# Patient Record
Sex: Male | Born: 1970 | Race: Black or African American | Hispanic: No | Marital: Married | Smoking: Never smoker
Health system: Southern US, Community
[De-identification: ages and names within clinical notes are randomized; demographics above are authoritative.]

## PROBLEM LIST (undated history)

## (undated) DIAGNOSIS — M109 Gout, unspecified: Secondary | ICD-10-CM

## (undated) DIAGNOSIS — G4730 Sleep apnea, unspecified: Secondary | ICD-10-CM

## (undated) DIAGNOSIS — Z87442 Personal history of urinary calculi: Secondary | ICD-10-CM

## (undated) DIAGNOSIS — R000 Tachycardia, unspecified: Secondary | ICD-10-CM

## (undated) DIAGNOSIS — N1330 Unspecified hydronephrosis: Secondary | ICD-10-CM

## (undated) DIAGNOSIS — Z9989 Dependence on other enabling machines and devices: Secondary | ICD-10-CM

## (undated) DIAGNOSIS — G4733 Obstructive sleep apnea (adult) (pediatric): Secondary | ICD-10-CM

## (undated) DIAGNOSIS — N390 Urinary tract infection, site not specified: Secondary | ICD-10-CM

## (undated) DIAGNOSIS — I517 Cardiomegaly: Secondary | ICD-10-CM

## (undated) DIAGNOSIS — Z973 Presence of spectacles and contact lenses: Secondary | ICD-10-CM

## (undated) DIAGNOSIS — E538 Deficiency of other specified B group vitamins: Secondary | ICD-10-CM

## (undated) DIAGNOSIS — B9620 Unspecified Escherichia coli [E. coli] as the cause of diseases classified elsewhere: Secondary | ICD-10-CM

## (undated) DIAGNOSIS — N179 Acute kidney failure, unspecified: Secondary | ICD-10-CM

## (undated) DIAGNOSIS — K5900 Constipation, unspecified: Secondary | ICD-10-CM

## (undated) DIAGNOSIS — N201 Calculus of ureter: Secondary | ICD-10-CM

## (undated) DIAGNOSIS — E119 Type 2 diabetes mellitus without complications: Secondary | ICD-10-CM

## (undated) DIAGNOSIS — I10 Essential (primary) hypertension: Secondary | ICD-10-CM

## (undated) DIAGNOSIS — I519 Heart disease, unspecified: Secondary | ICD-10-CM

## (undated) DIAGNOSIS — E1169 Type 2 diabetes mellitus with other specified complication: Secondary | ICD-10-CM

## (undated) DIAGNOSIS — R319 Hematuria, unspecified: Secondary | ICD-10-CM

## (undated) HISTORY — DX: Sleep apnea, unspecified: G47.30

## (undated) HISTORY — PX: VASECTOMY: SHX75

## (undated) HISTORY — DX: Type 2 diabetes mellitus with other specified complication: E11.69

## (undated) HISTORY — DX: Obstructive sleep apnea (adult) (pediatric): G47.33

## (undated) HISTORY — DX: Dependence on other enabling machines and devices: Z99.89

## (undated) HISTORY — DX: Deficiency of other specified B group vitamins: E53.8

---

## 1991-02-08 HISTORY — PX: CIRCUMCISION: SUR203

## 2008-01-14 ENCOUNTER — Inpatient Hospital Stay (HOSPITAL_COMMUNITY): Admission: EM | Admit: 2008-01-14 | Discharge: 2008-01-15 | Payer: Self-pay | Admitting: Emergency Medicine

## 2008-01-23 ENCOUNTER — Ambulatory Visit: Payer: Self-pay | Admitting: Internal Medicine

## 2009-11-20 ENCOUNTER — Encounter: Admission: RE | Admit: 2009-11-20 | Discharge: 2009-11-20 | Payer: Self-pay | Admitting: Internal Medicine

## 2010-06-22 NOTE — H&P (Signed)
NAMEELIGAH, ANELLO               ACCOUNT NO.:  [id number]   MEDICAL RECORD NO.:  [id number]          PATIENT TYPE:  INP   LOCATION:  0103                         FACILITY:  [facility]   PHYSICIAN:  Maryla Morrow, MD        DATE OF BIRTH:  1970/11/25   DATE OF ADMISSION:  01/13/2008  DATE OF DISCHARGE:                              HISTORY & PHYSICAL   PRIMARY CARE PHYSICIAN:  Unassigned.   CHIEF COMPLAINT:  Polyuria.   HISTORY OF PRESENT ILLNESS:  Mr. 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  He was also experiencing occasional episodes of blurry  vision.  He brought this to the attention of his mother who incidentally  checked his blood glucose and was found to be at a critical level.  He  was thereby sent to the ER for further evaluation.  In the ER his blood  glucose was found tobe  586.  He currently denies any symptom whatsoever  including chest pain, shortness of breath, headache, abdominal pain,  nausea, vomiting.  He does complain of polyuria and polydipsia as above.   PAST MEDICAL HISTORY:  None.   PAST SURGICAL HISTORY:  1. Vasectomy.  2. Circumcision.   ALLERGIES:  None.   MEDICATIONS:  None.   FAMILY HISTORY:  History of diabetes in mother, as well as kidney cancer  in father.  Negative for coronary artery disease, hypothyroidism.   SOCIAL HISTORY:  Negative for tobacco, alcohol or drug abuse.  The  patient is Veterinary surgeon by profession.  He is married and has two healthy  children.   REVIEW OF SYSTEMS:  All pertinent positives in HPI.  Complete 12-point  review of systems performed.   PHYSICAL EXAMINATION:  GENERAL:  40 year old [place] American male who  is currently oriented to time, place, and person, in no acute apparent  distress, obese.  VITAL SIGNS:  Stable vital signs.  HEENT:  Pupils equal, round, and  reactive to light.  Extraocular  movements are intact.  No JVD.  No lymphadenopathy.  Head is atraumatic  and normocephalic.  Oropharynx is clear.  CHEST:  Clear to auscultation bilaterally.  Equal expansion bilaterally.  HEART:  Regular rate and rhythm.  S1, S2 normal.  No murmurs, rubs or  gallops.  ABDOMEN:  Soft, nontender, nondistended.  EXTREMITIES:  No clubbing, cyanosis or edema.  Dorsalis pedis pulses are  2+ bilaterally.  Strength is 5/5. Sensory is intact.  Speech is normal.   LABORATORY AND DIAGNOSTIC DATA:  The pertinent labs include glucose of  586 and sodium 130 with potassium 4.3 and normal anion gap with  creatinine of 1.  His hemoglobin was 15.6 with a white count of 10.1.  Urinalysis was positive for glucose, otherwise negative.  Cardiac  enzymes first set was negative.   ASSESSMENT AND PLAN:  Mr. Garman is a very pleasant 40 year old gentleman  with:  1. Seizure.  2. Hyperglycemia with new-onset type 2 diabetes.  3. Obesity.  4. Hyponatremia, probably secondary to hyperglycemia.   RECOMMENDATIONS:  1. Start the patient on IV insulin and correct the sugar with a high      target of 150 by glucose stabilizer protocol.  The patient will      eventually be switched to basal insulin and mean coverage.  2. We will check a.m. labs for fasting lipid panel and hemoglobin A1c.  3. We will start patient on metformin and a statin.  4. P.r.n. medications.  5. Diabetic education and counseling in morning.      Maryla Morrow, MD  Electronically Signed     AP/MEDQ  D:  01/13/2008  T:  01/14/2008  Job:  [id number]

## 2010-06-22 NOTE — Discharge Summary (Signed)
Alan Payne, Alan Payne               ACCOUNT NO.:  [id number]   MEDICAL RECORD NO.:  [id number]          PATIENT TYPE:  INP   LOCATION:  1513                         FACILITY[facility]  [facility]   PHYSICIAN:  Marcellus Scott, MD     DATE OF BIRTH:  02-05-1971   DATE OF ADMISSION:  01/13/2008  DATE OF DISCHARGE:  01/15/2008                               DISCHARGE SUMMARY   PRIMARY MEDICAL DOCTOR:  Gentry Fitz.   DISCHARGE DIAGNOSES:  1. Newly diagnosed type 2 diabetes mellitus.  2. Hyperosmolar nonketotic state - resolved.  3. Pseudohyponatremia - resolved.  4. Dyslipidemia.  5. Obesity.   DISCHARGE MEDICATIONS:  1. Lantus 20 units subcutaneously daily.  2. Metformin 500 mg p.o. b.i.d.  3. Pravastatin 10 mg p.o. nightly.  4. NovoLog sliding scale insulin per directions.   PROCEDURE:  None.   PERTINENT LABS:  Lipid panel today with cholesterol 181, triglycerides  665, HDL 24, LDL and VLDL could not be calculated secondary to  hypertriglyceridemia.  Basic Metabolic Panel today with sodium 135,  potassium 4.5, chloride 107, bicarb 24, glucose 266, BUN 8, creatinine  0.75, calcium 9.1.  CBC was with hemoglobin 14.8, hematocrit 43, white  blood cells 8.2, platelets 247.  TSH 1.982.  Hemoglobin A1c 9.4.  Hepatic panel yesterday unremarkable, except for an albumin of 3.3.  Troponin and cardiac enzymes x1 negative.  Urinalysis not indicative of  urinary tract infection.  There was no proteinuria.   CONSULTATIONS:  Diabetes treatment program.   HOSPITAL COURSE AND PATIENT DISPOSITION:  Alan Payne is a very pleasant  40 year old African American male patient with no significant past  medical history.  He does have a strong family history of diabetes.  His  mother is a type 2 diabetic and his maternal uncle is a type 1 diabetic.  The patient has been experiencing polyuria, polydipsia and episodes of  blurred vision ongoing for a couple of weeks.  He brought this to the  attention of his mother who  being a diabetic, checked his blood sugar  and found it to be critically high and advised him to go to the nearest  ED.  In the emergency room the patient's blood sugar was found to be  586.  He was thereby admitted for further evaluation and management.  The patient was admitted to the step-down unit.  He was hydrated with IV  fluids and placed on an IV insulin drip.  With these measures the  patient's electrolytes improved.  He was then transitioned from IV  insulin to Lantus and sliding scale insulin.  Metformin and simvastatin  were started.  Diabetes education and instructions were provided.  Nutrition consult was also done.  Today, the patient's blood sugars  range between 202-226.  The diabetes treatment program has recommended  increasing his Lantus and increasing his NovoLog to the resistant scale.  The patient otherwise is asymptomatic at this time.  He will be  discharged.  He does not have primary medical doctor or insurance.  We  will try to arrange a [facility] appointment for him.  If he is not  eligible there, then we will provide referral to Della Goo, M.D.  who is still accepting new patients.  His Basic Metabolic Panel and  fasting lipids are to be repeated in a week from now.  The patient has  been educated regarding self-administration of insulin, frequent blood  sugar checks and hypoglycemic symptoms and management.   Time spent coordinating discharge: 30 minutes.      Marcellus Scott, MD  Electronically Signed     AH/MEDQ  D:  01/15/2008  T:  01/15/2008  Job:  [id number]   cc:   Melvern Banker  Fax: [phone]   Della Goo, M.D.  Fax: [phone]

## 2010-11-12 LAB — URINALYSIS, ROUTINE W REFLEX MICROSCOPIC
Bilirubin Urine: NEGATIVE
Glucose, UA: >1000 mg/dL — AB
Hgb urine dipstick: NEGATIVE
Ketones, ur: NEGATIVE mg/dL
Leukocytes, UA: NEGATIVE
Nitrite: NEGATIVE
Protein, ur: NEGATIVE mg/dL
Specific Gravity, Urine: 1.03 (ref 1.005–1.030)
Urobilinogen, UA: 0.2 mg/dL (ref 0.0–1.0)
pH: 6.5 (ref 5.0–8.0)

## 2010-11-12 LAB — CBC
HCT: 41.7 % (ref 39.0–52.0)
HCT: 43 % (ref 39.0–52.0)
HCT: 44.5 % (ref 39.0–52.0)
Hemoglobin: 14.3 g/dL (ref 13.0–17.0)
Hemoglobin: 14.8 g/dL (ref 13.0–17.0)
Hemoglobin: 15 g/dL (ref 13.0–17.0)
MCHC: 33.8 g/dL (ref 30.0–36.0)
MCHC: 34.4 g/dL (ref 30.0–36.0)
MCHC: 34.4 g/dL (ref 30.0–36.0)
MCV: 85.9 fL (ref 78.0–100.0)
MCV: 86.5 fL (ref 78.0–100.0)
MCV: 86.9 fL (ref 78.0–100.0)
Platelets: 239 10*3/uL (ref 150–400)
Platelets: 247 10*3/uL (ref 150–400)
Platelets: 250 10*3/uL (ref 150–400)
RBC: 4.85 MIL/uL (ref 4.22–5.81)
RBC: 4.97 MIL/uL (ref 4.22–5.81)
RBC: 5.12 MIL/uL (ref 4.22–5.81)
RDW: 13.7 % (ref 11.5–15.5)
RDW: 13.7 % (ref 11.5–15.5)
RDW: 13.7 % (ref 11.5–15.5)
WBC: 10.1 10*3/uL (ref 4.0–10.5)
WBC: 8.2 10*3/uL (ref 4.0–10.5)
WBC: 9.3 10*3/uL (ref 4.0–10.5)

## 2010-11-12 LAB — GLUCOSE, CAPILLARY
Glucose-Capillary: 107 mg/dL — ABNORMAL HIGH (ref 70–99)
Glucose-Capillary: 132 mg/dL — ABNORMAL HIGH (ref 70–99)
Glucose-Capillary: 139 mg/dL — ABNORMAL HIGH (ref 70–99)
Glucose-Capillary: 155 mg/dL — ABNORMAL HIGH (ref 70–99)
Glucose-Capillary: 163 mg/dL — ABNORMAL HIGH (ref 70–99)
Glucose-Capillary: 167 mg/dL — ABNORMAL HIGH (ref 70–99)
Glucose-Capillary: 174 mg/dL — ABNORMAL HIGH (ref 70–99)
Glucose-Capillary: 176 mg/dL — ABNORMAL HIGH (ref 70–99)
Glucose-Capillary: 181 mg/dL — ABNORMAL HIGH (ref 70–99)
Glucose-Capillary: 202 mg/dL — ABNORMAL HIGH (ref 70–99)
Glucose-Capillary: 221 mg/dL — ABNORMAL HIGH (ref 70–99)
Glucose-Capillary: 221 mg/dL — ABNORMAL HIGH (ref 70–99)
Glucose-Capillary: 225 mg/dL — ABNORMAL HIGH (ref 70–99)
Glucose-Capillary: 226 mg/dL — ABNORMAL HIGH (ref 70–99)
Glucose-Capillary: 265 mg/dL — ABNORMAL HIGH (ref 70–99)
Glucose-Capillary: 384 mg/dL — ABNORMAL HIGH (ref 70–99)
Glucose-Capillary: 473 mg/dL — ABNORMAL HIGH (ref 70–99)
Glucose-Capillary: 561 mg/dL (ref 70–99)
Glucose-Capillary: >600 mg/dL (ref 70–99)

## 2010-11-12 LAB — COMPREHENSIVE METABOLIC PANEL
ALT: 19 U/L (ref 0–53)
AST: 17 U/L (ref 0–37)
Albumin: 3.3 g/dL — ABNORMAL LOW (ref 3.5–5.2)
Alkaline Phosphatase: 84 U/L (ref 39–117)
BUN: 9 mg/dL (ref 6–23)
CO2: 26 mEq/L (ref 19–32)
Calcium: 9.2 mg/dL (ref 8.4–10.5)
Chloride: 103 mEq/L (ref 96–112)
Creatinine, Ser: 0.66 mg/dL (ref 0.4–1.5)
GFR calc Af Amer: >60 mL/min (ref >60)
GFR calc non Af Amer: >60 mL/min (ref >60)
Glucose, Bld: 168 mg/dL — ABNORMAL HIGH (ref 70–99)
Potassium: 3.5 mEq/L (ref 3.5–5.1)
Sodium: 136 mEq/L (ref 135–145)
Total Bilirubin: 0.8 mg/dL (ref 0.3–1.2)
Total Protein: 6.3 g/dL (ref 6.0–8.3)

## 2010-11-12 LAB — DIFFERENTIAL
Basophils Absolute: 0 10*3/uL (ref 0.0–0.1)
Basophils Relative: 0 % (ref 0–1)
Eosinophils Absolute: 0.2 10*3/uL (ref 0.0–0.7)
Eosinophils Relative: 2 % (ref 0–5)
Lymphocytes Relative: 20 % (ref 12–46)
Lymphs Abs: 2 10*3/uL (ref 0.7–4.0)
Monocytes Absolute: 0.6 10*3/uL (ref 0.1–1.0)
Monocytes Relative: 5 % (ref 3–12)
Neutro Abs: 7.4 10*3/uL (ref 1.7–7.7)
Neutrophils Relative %: 73 % (ref 43–77)

## 2010-11-12 LAB — CK TOTAL AND CKMB (NOT AT ARMC)
CK, MB: 0.8 ng/mL (ref 0.3–4.0)
Relative Index: 0.4 (ref 0.0–2.5)
Total CK: 206 U/L (ref 7–232)

## 2010-11-12 LAB — POCT I-STAT, CHEM 8
BUN: 11 mg/dL (ref 6–23)
Calcium, Ion: 1.22 mmol/L (ref 1.12–1.32)
Chloride: 97 mEq/L (ref 96–112)
Creatinine, Ser: 1 mg/dL (ref 0.4–1.5)
Glucose, Bld: 586 mg/dL (ref 70–99)
HCT: 46 % (ref 39.0–52.0)
Hemoglobin: 15.6 g/dL (ref 13.0–17.0)
Potassium: 4.3 mEq/L (ref 3.5–5.1)
Sodium: 130 mEq/L — ABNORMAL LOW (ref 135–145)
TCO2: 26 mmol/L (ref 0–100)

## 2010-11-12 LAB — LIPID PANEL
Cholesterol: 181 mg/dL (ref 0–200)
Cholesterol: 191 mg/dL (ref 0–200)
HDL: 23 mg/dL — ABNORMAL LOW (ref >39)
HDL: 24 mg/dL — ABNORMAL LOW (ref >39)
LDL Cholesterol: UNDETERMINED mg/dL (ref 0–99)
LDL Cholesterol: UNDETERMINED mg/dL (ref 0–99)
Total CHOL/HDL Ratio: 7.5 RATIO
Total CHOL/HDL Ratio: 8.3 RATIO
Triglycerides: 665 mg/dL — ABNORMAL HIGH (ref <150)
Triglycerides: 839 mg/dL — ABNORMAL HIGH (ref <150)
VLDL: UNDETERMINED mg/dL (ref 0–40)
VLDL: UNDETERMINED mg/dL (ref 0–40)

## 2010-11-12 LAB — HEMOGLOBIN A1C
Hgb A1c MFr Bld: 9.4 % — ABNORMAL HIGH (ref 4.6–6.1)
Mean Plasma Glucose: 223 mg/dL

## 2010-11-12 LAB — BASIC METABOLIC PANEL
BUN: 8 mg/dL (ref 6–23)
CO2: 24 mEq/L (ref 19–32)
Calcium: 9.1 mg/dL (ref 8.4–10.5)
Chloride: 107 mEq/L (ref 96–112)
Creatinine, Ser: 0.75 mg/dL (ref 0.4–1.5)
GFR calc Af Amer: >60 mL/min (ref >60)
GFR calc non Af Amer: >60 mL/min (ref >60)
Glucose, Bld: 266 mg/dL — ABNORMAL HIGH (ref 70–99)
Potassium: 4.5 mEq/L (ref 3.5–5.1)
Sodium: 135 mEq/L (ref 135–145)

## 2010-11-12 LAB — CALCIUM: Calcium: 9.1 mg/dL (ref 8.4–10.5)

## 2010-11-12 LAB — URINE MICROSCOPIC-ADD ON: Urine-Other: NONE SEEN

## 2010-11-12 LAB — TSH: TSH: 1.982 u[IU]/mL (ref 0.350–4.500)

## 2010-11-12 LAB — MAGNESIUM: Magnesium: 2.1 mg/dL (ref 1.5–2.5)

## 2010-11-12 LAB — TROPONIN I: Troponin I: 0.01 ng/mL (ref 0.00–0.06)

## 2010-11-12 LAB — PHOSPHORUS: Phosphorus: 3.6 mg/dL (ref 2.3–4.6)

## 2010-12-29 ENCOUNTER — Encounter (INDEPENDENT_AMBULATORY_CARE_PROVIDER_SITE_OTHER): Payer: Self-pay | Admitting: Surgery

## 2011-01-05 ENCOUNTER — Ambulatory Visit (INDEPENDENT_AMBULATORY_CARE_PROVIDER_SITE_OTHER): Payer: BC Managed Care – PPO | Admitting: Surgery

## 2011-01-05 ENCOUNTER — Encounter (INDEPENDENT_AMBULATORY_CARE_PROVIDER_SITE_OTHER): Payer: Self-pay | Admitting: Surgery

## 2011-01-05 VITALS — BP 124/86 | HR 64 | Temp 96.6°F | Resp 16 | Ht 72.0 in | Wt 324.0 lb

## 2011-01-05 DIAGNOSIS — K4090 Unilateral inguinal hernia, without obstruction or gangrene, not specified as recurrent: Secondary | ICD-10-CM

## 2011-01-05 DIAGNOSIS — N451 Epididymitis: Secondary | ICD-10-CM | POA: Insufficient documentation

## 2011-01-05 MED ORDER — NAPROXEN 500 MG PO TABS: 500.0000 mg | ORAL_TABLET | Freq: Two times a day (BID) | ORAL | Status: AC

## 2011-01-05 MED ORDER — DOXYCYCLINE HYCLATE 100 MG PO TABS: 100.0000 mg | ORAL_TABLET | Freq: Two times a day (BID) | ORAL | Status: AC

## 2011-01-05 NOTE — Progress Notes (Signed)
Chief Complaint  Patient presents with  . New Evaluation    Eval possible right inguinal hernia - referral by Dr. Juline Patch    HISTORY: Patient is a 40 year old black male referred by his primary care physician for possible right inguinal hernia. Patient gives a history of intermittent right testicular pain over the past year. It has improved in recent weeks. He had noted some swelling immediately adjacent to the testicle. He denies any bulge in the groin. Pain is more significant with physical activity. Patient has not had any prior hernia repairs.  Patient did undergo a vasectomy 12 years ago by his urologist.   Past Medical History  Diagnosis Date  . OSA on CPAP   . Diabetes mellitus      Current Outpatient Prescriptions  Medication Sig Dispense Refill  . aspirin 81 MG tablet Take 81 mg by mouth daily.        Marland Kitchen doxycycline (VIBRA-TABS) 100 MG tablet Take 1 tablet (100 mg total) by mouth 2 (two) times daily.  28 tablet  0  . metFORMIN (GLUCOPHAGE) 500 MG tablet Take 500 mg by mouth 2 (two) times daily with a meal.        . naproxen (NAPROSYN) 500 MG tablet Take 1 tablet (500 mg total) by mouth 2 (two) times daily with a meal.  30 tablet  1  . saxagliptin HCl (ONGLYZA) 5 MG TABS tablet Take by mouth daily.           Allergies  Allergen Reactions  . Janumet      Family History  Problem Relation Age of Onset  . Kidney cancer Father   . Hypertension Father   . Cancer Father     kidney  . Diabetes Mother      History   Social History  . Marital Status: Married    Spouse Name: N/A    Number of Children: N/A  . Years of Education: N/A   Social History Main Topics  . Smoking status: Never Smoker   . Smokeless tobacco: Never Used  . Alcohol Use: No  . Drug Use: No  . Sexually Active: None   Other Topics Concern  . None   Social History Narrative  . None     REVIEW OF SYSTEMS - PERTINENT POSITIVES ONLY: Right testicular pain, intermittent; no  obstructive symptoms   EXAM: Filed Vitals:   01/05/11 0858  BP: 124/86  Pulse: 64  Temp: 96.6 F (35.9 C)  Resp: 16    HEENT: normocephalic; pupils equal and reactive; sclerae clear; dentition good; mucous membranes moist NECK:  No nodules; symmetric on extension; no palpable anterior or posterior cervical lymphadenopathy; no supraclavicular masses; no tenderness CHEST: clear to auscultation bilaterally without rales, rhonchi, or wheezes CARDIAC: regular rate and rhythm without significant murmur; peripheral pulses are full GU:  Right testicle with tenderness at epididymis; no masses; no sign of inguinal hernia either side with cough or Valsalva; penis normal EXT:  non-tender without edema; no deformity NEURO: no gross focal deficits; no sign of tremor   LABORATORY RESULTS: See E-Chart for most recent results   RADIOLOGY RESULTS: See E-Chart or I-Site for most recent results   IMPRESSION: #1 no physical findings of inguinal hernia #2 right testicular tenderness, suspect epididymitis   PLAN: The patient and I discussed the above findings. At this point I do not think he requires surgical intervention. I would like to treat him with doxycycline and Naprosyn for 2 weeks and then evaluate him  again here in the office with repeat physical examination.  The patient is in agreement and will return in approximately 4 weeks for repeat examination.   Velora Heckler, MD, FACS General & Endocrine Surgery [facility]    Visit Diagnoses: 1. Inguinal hernia unilateral, non-recurrent, right     Primary Care Physician: Juline Patch, MD, MD

## 2011-01-05 NOTE — Patient Instructions (Signed)
Wear supportive underwear for comfort.  Warm tub soaks for comfort.  Avoid lifting.  tmg

## 2011-02-16 ENCOUNTER — Encounter (INDEPENDENT_AMBULATORY_CARE_PROVIDER_SITE_OTHER): Payer: BC Managed Care – PPO | Admitting: Surgery

## 2012-02-03 ENCOUNTER — Emergency Department (HOSPITAL_COMMUNITY): Payer: BC Managed Care – PPO

## 2012-02-03 ENCOUNTER — Emergency Department (HOSPITAL_COMMUNITY)
Admission: EM | Admit: 2012-02-03 | Discharge: 2012-02-03 | Disposition: A | Discharge status: Home / Self Care | Payer: BC Managed Care – PPO | Attending: Emergency Medicine | Admitting: Emergency Medicine

## 2012-02-03 ENCOUNTER — Encounter (HOSPITAL_COMMUNITY): Payer: Self-pay | Admitting: Emergency Medicine

## 2012-02-03 DIAGNOSIS — Z9852 Vasectomy status: Secondary | ICD-10-CM | POA: Insufficient documentation

## 2012-02-03 DIAGNOSIS — Z7982 Long term (current) use of aspirin: Secondary | ICD-10-CM | POA: Insufficient documentation

## 2012-02-03 DIAGNOSIS — G4733 Obstructive sleep apnea (adult) (pediatric): Secondary | ICD-10-CM | POA: Insufficient documentation

## 2012-02-03 DIAGNOSIS — Z79899 Other long term (current) drug therapy: Secondary | ICD-10-CM | POA: Insufficient documentation

## 2012-02-03 DIAGNOSIS — N200 Calculus of kidney: Secondary | ICD-10-CM | POA: Insufficient documentation

## 2012-02-03 DIAGNOSIS — E119 Type 2 diabetes mellitus without complications: Secondary | ICD-10-CM | POA: Insufficient documentation

## 2012-02-03 LAB — COMPREHENSIVE METABOLIC PANEL WITH GFR
ALT: 23 U/L (ref 0–53)
AST: 33 U/L (ref 0–37)
Albumin: 3.7 g/dL (ref 3.5–5.2)
Alkaline Phosphatase: 69 U/L (ref 39–117)
BUN: 9 mg/dL (ref 6–23)
CO2: 22 meq/L (ref 19–32)
Calcium: 9.2 mg/dL (ref 8.4–10.5)
Chloride: 105 meq/L (ref 96–112)
Creatinine, Ser: 0.94 mg/dL (ref 0.50–1.35)
GFR calc Af Amer: >90 mL/min (ref >90)
GFR calc non Af Amer: >90 mL/min (ref >90)
Glucose, Bld: 185 mg/dL — ABNORMAL HIGH (ref 70–99)
Potassium: 4.5 meq/L (ref 3.5–5.1)
Sodium: 136 meq/L (ref 135–145)
Total Bilirubin: 0.3 mg/dL (ref 0.3–1.2)
Total Protein: 7.5 g/dL (ref 6.0–8.3)

## 2012-02-03 LAB — CBC WITH DIFFERENTIAL/PLATELET
Basophils Absolute: 0 10*3/uL (ref 0.0–0.1)
Basophils Relative: 0 % (ref 0–1)
Eosinophils Absolute: 0 10*3/uL (ref 0.0–0.7)
Eosinophils Relative: 0 % (ref 0–5)
HCT: 41.1 % (ref 39.0–52.0)
Hemoglobin: 13.7 g/dL (ref 13.0–17.0)
Lymphocytes Relative: 12 % (ref 12–46)
Lymphs Abs: 1.2 10*3/uL (ref 0.7–4.0)
MCH: 27.8 pg (ref 26.0–34.0)
MCHC: 33.3 g/dL (ref 30.0–36.0)
MCV: 83.5 fL (ref 78.0–100.0)
Monocytes Absolute: 0.7 10*3/uL (ref 0.1–1.0)
Monocytes Relative: 7 % (ref 3–12)
Neutro Abs: 8.1 10*3/uL — ABNORMAL HIGH (ref 1.7–7.7)
Neutrophils Relative %: 81 % — ABNORMAL HIGH (ref 43–77)
Platelets: 229 10*3/uL (ref 150–400)
RBC: 4.92 MIL/uL (ref 4.22–5.81)
RDW: 13.7 % (ref 11.5–15.5)
WBC: 10 10*3/uL (ref 4.0–10.5)

## 2012-02-03 LAB — URINALYSIS, MICROSCOPIC ONLY
Bilirubin Urine: NEGATIVE
Glucose, UA: NEGATIVE mg/dL
Ketones, ur: NEGATIVE mg/dL
Leukocytes, UA: NEGATIVE
Nitrite: NEGATIVE
Protein, ur: 30 mg/dL — AB
Specific Gravity, Urine: 1.022 (ref 1.005–1.030)
Urobilinogen, UA: 0.2 mg/dL (ref 0.0–1.0)
pH: 5.5 (ref 5.0–8.0)

## 2012-02-03 LAB — LIPASE, BLOOD: Lipase: 15 U/L (ref 11–59)

## 2012-02-03 MED ORDER — KETOROLAC TROMETHAMINE 30 MG/ML IJ SOLN
30.0000 mg | Freq: Once | INTRAMUSCULAR | Status: AC
  Administered 2012-02-03: 30 mg via INTRAVENOUS
  Filled 2012-02-03: qty 1

## 2012-02-03 MED ORDER — TAMSULOSIN HCL 0.4 MG PO CAPS: 0.4000 mg | ORAL_CAPSULE | Freq: Every day | ORAL | Status: DC

## 2012-02-03 MED ORDER — ONDANSETRON HCL 4 MG/2ML IJ SOLN: 4.0000 mg | Freq: Once | INTRAMUSCULAR | Status: DC

## 2012-02-03 MED ORDER — IBUPROFEN 400 MG PO TABS: 400.0000 mg | ORAL_TABLET | Freq: Four times a day (QID) | ORAL | Status: DC | PRN

## 2012-02-03 MED ORDER — OXYCODONE-ACETAMINOPHEN 7.5-325 MG PO TABS: 1.0000 | ORAL_TABLET | ORAL | Status: DC | PRN

## 2012-02-03 MED ORDER — ONDANSETRON HCL 4 MG/2ML IJ SOLN
4.0000 mg | Freq: Once | INTRAMUSCULAR | Status: AC
  Administered 2012-02-03: 4 mg via INTRAVENOUS
  Filled 2012-02-03: qty 2

## 2012-02-03 MED ORDER — HYDROMORPHONE HCL PF 1 MG/ML IJ SOLN
1.0000 mg | Freq: Once | INTRAMUSCULAR | Status: AC
  Administered 2012-02-03: 1 mg via INTRAVENOUS
  Filled 2012-02-03: qty 1

## 2012-02-03 NOTE — ED Provider Notes (Signed)
History     CSN: [id number]  Arrival date & time 02/03/12  1536   First MD Initiated Contact with Patient 02/03/12 1557      Chief Complaint  Patient presents with  . Flank Pain  . Abdominal Pain     HPI Pt began having R flank pain and emesis at 0830 this am. Pt states pain starts at RLQ and radiates around to R flank. EMS reports he was throwing up bile upon arrival. No hx of kidney stones or abd surgeries. fentanyl given by ems  Past Medical History  Diagnosis Date  . OSA on CPAP   . Diabetes mellitus     Past Surgical History  Procedure Date  . Vasectomy 2000    Family History  Problem Relation Age of Onset  . Kidney cancer Father   . Hypertension Father   . Cancer Father     kidney  . Diabetes Mother     History  Substance Use Topics  . Smoking status: Never Smoker   . Smokeless tobacco: Never Used  . Alcohol Use: No      Review of Systems All other systems reviewed and are negative Allergies  Sitagliptin-metformin hcl  Home Medications   Current Outpatient Rx  Name  Route  Sig  Dispense  Refill  . ASPIRIN 81 MG PO TABS   Oral   Take 81 mg by mouth daily.           Marland Kitchen METFORMIN HCL 500 MG PO TABS   Oral   Take 500 mg by mouth 2 (two) times daily with a meal.           . SAXAGLIPTIN HCL 5 MG PO TABS   Oral   Take 5 mg by mouth daily.          . IBUPROFEN 400 MG PO TABS   Oral   Take 1 tablet (400 mg total) by mouth every 6 (six) hours as needed for pain.   30 tablet   0   . OXYCODONE-ACETAMINOPHEN 7.5-325 MG PO TABS   Oral   Take 1 tablet by mouth every 4 (four) hours as needed for pain.   30 tablet   0   . TAMSULOSIN HCL 0.4 MG PO CAPS   Oral   Take 1 capsule (0.4 mg total) by mouth daily.   5 capsule   0     BP 157/69  Pulse 88  Temp 97.6 F (36.4 C) (Oral)  Resp 16  SpO2 96%  Physical Exam  Nursing note and vitals reviewed. Constitutional: He is oriented to person, place, and time. He appears  well-developed and well-nourished. He appears distressed (From pain).  HENT:  Head: Normocephalic and atraumatic.  Eyes: Pupils are equal, round, and reactive to light.  Neck: Normal range of motion.  Cardiovascular: Normal rate and intact distal pulses.   Pulmonary/Chest: No respiratory distress.  Abdominal: Normal appearance. He exhibits no distension. There is no rebound and no guarding.  Musculoskeletal: Normal range of motion.  Neurological: He is alert and oriented to person, place, and time. No cranial nerve deficit.  Skin: Skin is warm and dry. No rash noted.  Psychiatric: He has a normal mood and affect. His behavior is normal.    ED Course  Procedures (including critical care time)  Labs Reviewed  COMPREHENSIVE METABOLIC PANEL - Abnormal; Notable for the following:    Glucose, Bld 185 (*)     All other components within normal limits  CBC WITH DIFFERENTIAL - Abnormal; Notable for the following:    Neutrophils Relative 81 (*)     Neutro Abs 8.1 (*)     All other components within normal limits  URINALYSIS, MICROSCOPIC ONLY - Abnormal; Notable for the following:    APPearance CLOUDY (*)     Hgb urine dipstick LARGE (*)     Protein, ur 30 (*)     Bacteria, UA FEW (*)     All other components within normal limits  LIPASE, BLOOD   Ct Abdomen Pelvis Wo Contrast  02/03/2012  *RADIOLOGY REPORT*  Clinical Data: Right lower quadrant pain, right flank pain  CT ABDOMEN AND PELVIS WITHOUT CONTRAST  Technique:  Multidetector CT imaging of the abdomen and pelvis was performed following the standard protocol without intravenous contrast.  Comparison: No  Findings:  Renal:  Non-IV contrast images demonstrate a 4 mm calculus in the lower pole of the right kidney.  There is a 5 mm calculus lower pole of the left kidney.  There is mild hydronephrosis or hydroureter on the right.  There is mild perinephric and periureteral stranding on the right.  This mild obstructive uropathy is  secondary  to a partially obstructing calculus in the proximal right ureter measuring 3 mm (image 53).  This calculus is at the level of the L3 vertebral body but is not readily evident on the CT scout.  There is no distal ureteral calculi on the left or right.  Lung bases are clear.  No pericardial fluid.  No focal hepatic lesion on this noncontrast exam.  The gallbladder, pancreas, spleen, adrenal glands normal.  The stomach, small bowel, appendix, and colon are normal.  Abdominal aorta normal.  No retroperitoneal periportal lymphadenopathy.  No free fluid the pelvis.  No distal ureteral stones or bladder stones. Review of  bone windows demonstrates no aggressive osseous lesions.  IMPRESSION:  1.  Partially  obstructing calculus in the proximal right ureter. 2.  Bilateral nephrolithiasis. 3.  Normal appendix   Original Report Authenticated By: Genevive Bi, M.D.     Medications  oxyCODONE-acetaminophen (PERCOCET) 7.5-325 MG per tablet (not administered)  ibuprofen (ADVIL,MOTRIN) 400 MG tablet (not administered)  Tamsulosin HCl (FLOMAX) 0.4 MG CAPS (not administered)  ondansetron (ZOFRAN) injection 4 mg (4 mg Intravenous Given 02/03/12 1642)  ketorolac (TORADOL) 30 MG/ML injection 30 mg (30 mg Intravenous Given 02/03/12 1642)  HYDROmorphone (DILAUDID) injection 1 mg (1 mg Intravenous Given 02/03/12 1642)     1. Nephrolithiasis       MDM  After treatment in the ED the patient feels back to baseline and wants to go home.        Nelia Shi, MD 02/03/12 [phone]

## 2012-02-03 NOTE — ED Notes (Signed)
RUE:AV40<JW> Expected date:02/03/12<BR> Expected time: 3:34 PM<BR> Means of arrival:Ambulance<BR> Comments:<BR> 80yoF/n/v

## 2012-02-03 NOTE — Progress Notes (Signed)
Pt family approached nurses station requesting update on eta for pain medication. Pt rn with another patient at this time, however csw informed rn of patient needs. CSW let pt know that RN will be coming to follow up with pt needs and aware of pain medication need.   Catha Gosselin, LCSWA  [phone] .02/03/2012 1614pm

## 2012-02-03 NOTE — ED Notes (Signed)
Pt began having R flank pain and emesis at 0830 this am.  Pt states pain starts at RLQ and radiates around to R flank.  EMS reports he was throwing up bile upon arrival.  No hx of kidney stones or abd surgeries. fentanyl given by ems

## 2012-03-25 ENCOUNTER — Encounter (HOSPITAL_BASED_OUTPATIENT_CLINIC_OR_DEPARTMENT_OTHER): Payer: Self-pay | Admitting: *Deleted

## 2012-03-25 ENCOUNTER — Emergency Department (HOSPITAL_BASED_OUTPATIENT_CLINIC_OR_DEPARTMENT_OTHER)
Admission: EM | Admit: 2012-03-25 | Discharge: 2012-03-25 | Disposition: A | Discharge status: Home / Self Care | Payer: BC Managed Care – PPO | Attending: Emergency Medicine | Admitting: Emergency Medicine

## 2012-03-25 DIAGNOSIS — R319 Hematuria, unspecified: Secondary | ICD-10-CM

## 2012-03-25 DIAGNOSIS — R6883 Chills (without fever): Secondary | ICD-10-CM | POA: Insufficient documentation

## 2012-03-25 DIAGNOSIS — N200 Calculus of kidney: Secondary | ICD-10-CM

## 2012-03-25 DIAGNOSIS — E119 Type 2 diabetes mellitus without complications: Secondary | ICD-10-CM | POA: Insufficient documentation

## 2012-03-25 DIAGNOSIS — Z79899 Other long term (current) drug therapy: Secondary | ICD-10-CM | POA: Insufficient documentation

## 2012-03-25 DIAGNOSIS — Z7982 Long term (current) use of aspirin: Secondary | ICD-10-CM | POA: Insufficient documentation

## 2012-03-25 DIAGNOSIS — N390 Urinary tract infection, site not specified: Secondary | ICD-10-CM | POA: Insufficient documentation

## 2012-03-25 DIAGNOSIS — Z87448 Personal history of other diseases of urinary system: Secondary | ICD-10-CM | POA: Insufficient documentation

## 2012-03-25 DIAGNOSIS — G4733 Obstructive sleep apnea (adult) (pediatric): Secondary | ICD-10-CM | POA: Insufficient documentation

## 2012-03-25 DIAGNOSIS — R109 Unspecified abdominal pain: Secondary | ICD-10-CM | POA: Insufficient documentation

## 2012-03-25 DIAGNOSIS — R110 Nausea: Secondary | ICD-10-CM | POA: Insufficient documentation

## 2012-03-25 HISTORY — DX: Hematuria, unspecified: R31.9

## 2012-03-25 LAB — BASIC METABOLIC PANEL
BUN: 8 mg/dL (ref 6–23)
CO2: 24 mEq/L (ref 19–32)
Calcium: 9.6 mg/dL (ref 8.4–10.5)
Chloride: 101 mEq/L (ref 96–112)
Creatinine, Ser: 0.8 mg/dL (ref 0.50–1.35)
GFR calc Af Amer: >90 mL/min (ref >90)
GFR calc non Af Amer: >90 mL/min (ref >90)
Glucose, Bld: 164 mg/dL — ABNORMAL HIGH (ref 70–99)
Potassium: 4 mEq/L (ref 3.5–5.1)
Sodium: 137 mEq/L (ref 135–145)

## 2012-03-25 LAB — URINALYSIS, ROUTINE W REFLEX MICROSCOPIC
Glucose, UA: NEGATIVE mg/dL
Ketones, ur: 40 mg/dL — AB
Nitrite: POSITIVE — AB
Protein, ur: 100 mg/dL — AB
Specific Gravity, Urine: 1.028 (ref 1.005–1.030)
Urobilinogen, UA: 1 mg/dL (ref 0.0–1.0)
pH: 5 (ref 5.0–8.0)

## 2012-03-25 LAB — URINE MICROSCOPIC-ADD ON

## 2012-03-25 LAB — CBC
HCT: 42.9 % (ref 39.0–52.0)
Hemoglobin: 14.8 g/dL (ref 13.0–17.0)
MCH: 28.5 pg (ref 26.0–34.0)
MCHC: 34.5 g/dL (ref 30.0–36.0)
MCV: 82.7 fL (ref 78.0–100.0)
Platelets: 214 10*3/uL (ref 150–400)
RBC: 5.19 MIL/uL (ref 4.22–5.81)
RDW: 14.2 % (ref 11.5–15.5)
WBC: 16.1 10*3/uL — ABNORMAL HIGH (ref 4.0–10.5)

## 2012-03-25 MED ORDER — CIPROFLOXACIN HCL 500 MG PO TABS: 500.0000 mg | ORAL_TABLET | Freq: Two times a day (BID) | ORAL | Status: DC

## 2012-03-25 NOTE — ED Provider Notes (Signed)
History  This chart was scribed for Ethelda Chick, MD by Shari Heritage, ED Scribe. The patient was seen in room MH04/MH04. Patient's care was started at 1603.   CSN: [id number]  Arrival date & time 03/25/12  1431   First MD Initiated Contact with Patient 03/25/12 1603      Chief Complaint  Patient presents with  . Hematuria     Patient is a 42 y.o. male presenting with hematuria. The history is provided by the patient. No language interpreter was used.  Hematuria This is a new problem. The current episode started yesterday. The problem occurs constantly. The problem has not changed since onset.Nothing aggravates the symptoms. Nothing relieves the symptoms.     HPI Comments: Alan Payne is a 42 y.o. male with two known kidney stones who presents to the Emergency Department complaining of persistent hematuria onset this morning. There is associated chills, right flank pain and nausea. Patient states that blood has appeared bright red and he has noticed some clots. Patient denies vomiting or fever. Patient has been taking ibuprofen with no relief. Patient states that he was already seen at an Urgent Care Center today and he was advised to come here for further evaluation. Patient reports that he has had hematuria before 10 years ago when he had a serious kidney infection. Patient's other medical history includes diabetes.   Past Medical History  Diagnosis Date  . OSA on CPAP   . Diabetes mellitus   . Renal disorder     Past Surgical History  Procedure Laterality Date  . Vasectomy  2000    Family History  Problem Relation Age of Onset  . Kidney cancer Father   . Hypertension Father   . Cancer Father     kidney  . Diabetes Mother     History  Substance Use Topics  . Smoking status: Never Smoker   . Smokeless tobacco: Never Used  . Alcohol Use: No      Review of Systems  Constitutional: Positive for chills. Negative for fever.  Gastrointestinal: Positive for  nausea. Negative for vomiting.  Genitourinary: Positive for hematuria and flank pain.  All other systems reviewed and are negative.    Allergies  Sitagliptin-metformin hcl  Home Medications   Current Outpatient Rx  Name  Route  Sig  Dispense  Refill  . aspirin 81 MG tablet   Oral   Take 81 mg by mouth daily.           . ciprofloxacin (CIPRO) 500 MG tablet   Oral   Take 1 tablet (500 mg total) by mouth 2 (two) times daily.   20 tablet   0   . ibuprofen (ADVIL,MOTRIN) 400 MG tablet   Oral   Take 1 tablet (400 mg total) by mouth every 6 (six) hours as needed for pain.   30 tablet   0   . metFORMIN (GLUCOPHAGE) 500 MG tablet   Oral   Take 500 mg by mouth 2 (two) times daily with a meal.           . oxyCODONE-acetaminophen (PERCOCET) 7.5-325 MG per tablet   Oral   Take 1 tablet by mouth every 4 (four) hours as needed for pain.   30 tablet   0   . saxagliptin HCl (ONGLYZA) 5 MG TABS tablet   Oral   Take 5 mg by mouth daily.          . Tamsulosin HCl (FLOMAX) 0.4 MG CAPS  Oral   Take 1 capsule (0.4 mg total) by mouth daily.   5 capsule   0     Triage Vitals: BP 172/102  Pulse 77  Temp(Src) 98.3 F (36.8 C) (Oral)  Resp 18  Ht 6' (1.829 m)  Wt 318 lb (144.244 kg)  BMI 43.12 kg/m2  SpO2 100%  Physical Exam  Nursing note and vitals reviewed. Constitutional: He is oriented to person, place, and time. He appears well-developed and well-nourished.  HENT:  Head: Normocephalic and atraumatic.  Eyes: Conjunctivae and EOM are normal. Pupils are equal, round, and reactive to light.  Neck: Normal range of motion. Neck supple.  Cardiovascular: Normal rate, regular rhythm and normal heart sounds.   Pulmonary/Chest: Effort normal and breath sounds normal.  Abdominal: Soft. Bowel sounds are normal.  Musculoskeletal: Normal range of motion.  Neurological: He is alert and oriented to person, place, and time.  Skin: Skin is warm and dry.  Psychiatric: He has a  normal mood and affect.    ED Course  Procedures (including critical care time) DIAGNOSTIC STUDIES: Oxygen Saturation is 100% on room air, normal by my interpretation.    COORDINATION OF CARE: 4:24 PM- Patient informed of current plan for treatment and evaluation and agrees with plan at this time.    Labs Reviewed  URINALYSIS, ROUTINE W REFLEX MICROSCOPIC - Abnormal; Notable for the following:    Color, Urine RED (*)    APPearance TURBID (*)    Hgb urine dipstick LARGE (*)    Bilirubin Urine LARGE (*)    Ketones, ur 40 (*)    Protein, ur 100 (*)    Nitrite POSITIVE (*)    Leukocytes, UA LARGE (*)    All other components within normal limits  URINE MICROSCOPIC-ADD ON - Abnormal; Notable for the following:    Bacteria, UA MANY (*)    All other components within normal limits  CBC - Abnormal; Notable for the following:    WBC 16.1 (*)    All other components within normal limits  BASIC METABOLIC PANEL - Abnormal; Notable for the following:    Glucose, Bld 164 (*)    All other components within normal limits  URINE CULTURE    No results found.   1. Hematuria   2. Urinary tract infection   3. Renal stone       MDM  Pt presenting with c/o blood in urine.  He has recent hx of renal stones and was told he had 2 more that would pass.  He is having some mild pain in his back which was relieved with ibuprofen.  ua shows RBCs, nitrite postive- this is c/w UTI-there may be some element of stone as well.  Pt started on cipro for UTI and was given information for urology followup.  Kidney function is normal, so I elected not to repeat CT scan as he has just had one performed so recently.  Discharged with strict return precautions.  Pt agreeable with plan.    I personally performed the services described in this documentation, which was scribed in my presence. The recorded information has been reviewed and is accurate.    Ethelda Chick, MD 03/25/12 [phone]

## 2012-03-25 NOTE — ED Notes (Signed)
Pt states he was just seen at [facility]. Told to come to ED. Passed kidney stone last month and told he had 2 more. Also c/o back pain. Took Ibuprofen which helped. Fever, pain initially. None now. Also hx of hematuria about 10 years ago. Dx'd with bad kidney infection.

## 2012-03-27 LAB — URINE CULTURE: Colony Count: >=100000

## 2012-03-29 NOTE — ED Notes (Signed)
+   Urine Patient treated with Cipro-sensitive to same-chart appended per protocol MD. 

## 2012-08-27 ENCOUNTER — Other Ambulatory Visit: Payer: Self-pay | Admitting: Internal Medicine

## 2012-08-27 DIAGNOSIS — R319 Hematuria, unspecified: Secondary | ICD-10-CM

## 2012-08-28 ENCOUNTER — Ambulatory Visit
Admission: RE | Admit: 2012-08-28 | Discharge: 2012-08-28 | Disposition: A | Discharge status: Home / Self Care | Payer: BC Managed Care – PPO | Source: Ambulatory Visit | Attending: Internal Medicine | Admitting: Internal Medicine

## 2012-08-28 DIAGNOSIS — R319 Hematuria, unspecified: Secondary | ICD-10-CM

## 2013-03-10 DIAGNOSIS — N390 Urinary tract infection, site not specified: Secondary | ICD-10-CM

## 2013-03-10 DIAGNOSIS — B9620 Unspecified Escherichia coli [E. coli] as the cause of diseases classified elsewhere: Secondary | ICD-10-CM

## 2013-03-10 HISTORY — DX: Urinary tract infection, site not specified: B96.20

## 2013-03-10 HISTORY — DX: Unspecified Escherichia coli (E. coli) as the cause of diseases classified elsewhere: N39.0

## 2014-06-16 ENCOUNTER — Emergency Department
Admission: EM | Admit: 2014-06-16 | Discharge: 2014-06-16 | Disposition: A | Discharge status: Home / Self Care | Payer: BLUE CROSS/BLUE SHIELD | Attending: Emergency Medicine | Admitting: Emergency Medicine

## 2014-06-16 ENCOUNTER — Encounter: Payer: Self-pay | Admitting: Emergency Medicine

## 2014-06-16 DIAGNOSIS — E119 Type 2 diabetes mellitus without complications: Secondary | ICD-10-CM | POA: Insufficient documentation

## 2014-06-16 DIAGNOSIS — Z792 Long term (current) use of antibiotics: Secondary | ICD-10-CM | POA: Insufficient documentation

## 2014-06-16 DIAGNOSIS — Z7982 Long term (current) use of aspirin: Secondary | ICD-10-CM | POA: Insufficient documentation

## 2014-06-16 DIAGNOSIS — R109 Unspecified abdominal pain: Secondary | ICD-10-CM | POA: Diagnosis present

## 2014-06-16 DIAGNOSIS — N201 Calculus of ureter: Secondary | ICD-10-CM | POA: Diagnosis not present

## 2014-06-16 DIAGNOSIS — Z79899 Other long term (current) drug therapy: Secondary | ICD-10-CM | POA: Diagnosis not present

## 2014-06-16 LAB — URINALYSIS COMPLETE WITH MICROSCOPIC (ARMC ONLY)
Bilirubin Urine: NEGATIVE
Glucose, UA: NEGATIVE mg/dL
Ketones, ur: NEGATIVE mg/dL
Nitrite: NEGATIVE
Protein, ur: 30 mg/dL — AB
Specific Gravity, Urine: 1.015 (ref 1.005–1.030)
pH: 5 (ref 5.0–8.0)

## 2014-06-16 MED ORDER — OXYCODONE-ACETAMINOPHEN 5-325 MG PO TABS
ORAL_TABLET | ORAL | Status: AC
  Filled 2014-06-16: qty 1

## 2014-06-16 MED ORDER — KETOROLAC TROMETHAMINE 30 MG/ML IJ SOLN
30.0000 mg | Freq: Once | INTRAMUSCULAR | Status: AC
  Administered 2014-06-16: 30 mg via INTRAVENOUS

## 2014-06-16 MED ORDER — HYDROMORPHONE HCL 1 MG/ML IJ SOLN
1.0000 mg | Freq: Once | INTRAMUSCULAR | Status: AC
Start: 2014-06-16 — End: 2014-06-16
  Administered 2014-06-16: 16:00:00 via INTRAVENOUS

## 2014-06-16 MED ORDER — HYDROMORPHONE HCL 1 MG/ML IJ SOLN
INTRAMUSCULAR | Status: AC
  Administered 2014-06-16: 1 mg via INTRAVENOUS
  Filled 2014-06-16: qty 1

## 2014-06-16 MED ORDER — HYDROMORPHONE HCL 1 MG/ML IJ SOLN
1.0000 mg | Freq: Once | INTRAMUSCULAR | Status: AC
  Administered 2014-06-16: 1 mg via INTRAVENOUS
  Administered 2014-06-16: 0.5 mg via INTRAVENOUS

## 2014-06-16 MED ORDER — OXYCODONE-ACETAMINOPHEN 7.5-325 MG PO TABS: 1.0000 | ORAL_TABLET | ORAL | Status: DC | PRN

## 2014-06-16 MED ORDER — CIPROFLOXACIN HCL 500 MG PO TABS: 500.0000 mg | ORAL_TABLET | Freq: Two times a day (BID) | ORAL | Status: DC

## 2014-06-16 MED ORDER — OXYCODONE-ACETAMINOPHEN 5-325 MG PO TABS
1.0000 | ORAL_TABLET | Freq: Once | ORAL | Status: AC
  Administered 2014-06-16: 1 via ORAL

## 2014-06-16 MED ORDER — OXYCODONE-ACETAMINOPHEN 7.5-325 MG PO TABS: 1.0000 | ORAL_TABLET | ORAL | Status: DC

## 2014-06-16 MED ORDER — ONDANSETRON HCL 4 MG/2ML IJ SOLN
4.0000 mg | Freq: Once | INTRAMUSCULAR | Status: AC
  Administered 2014-06-16: 4 mg via INTRAVENOUS

## 2014-06-16 MED ORDER — KETOROLAC TROMETHAMINE 60 MG/2ML IM SOLN
INTRAMUSCULAR | Status: AC
  Filled 2014-06-16: qty 2

## 2014-06-16 MED ORDER — HYDROMORPHONE HCL 1 MG/ML IJ SOLN
INTRAMUSCULAR | Status: AC
  Filled 2014-06-16: qty 1

## 2014-06-16 MED ORDER — HYDROMORPHONE HCL 1 MG/ML IJ SOLN: 0.5000 mg | Freq: Once | INTRAMUSCULAR | Status: DC

## 2014-06-16 MED ORDER — TAMSULOSIN HCL 0.4 MG PO CAPS: 0.4000 mg | ORAL_CAPSULE | Freq: Every day | ORAL | Status: DC

## 2014-06-16 MED ORDER — HYDROMORPHONE HCL 1 MG/ML IJ SOLN
INTRAMUSCULAR | Status: AC
  Administered 2014-06-16: 0.5 mg via INTRAVENOUS
  Filled 2014-06-16: qty 1

## 2014-06-16 MED ORDER — SODIUM CHLORIDE 0.9 % IV BOLUS (SEPSIS)
1000.0000 mL | Freq: Once | INTRAVENOUS | Status: AC
  Administered 2014-06-16: 1000 mL via INTRAVENOUS

## 2014-06-16 MED ORDER — ONDANSETRON HCL 4 MG/2ML IJ SOLN
INTRAMUSCULAR | Status: AC
  Administered 2014-06-16: 4 mg via INTRAVENOUS
  Filled 2014-06-16: qty 2

## 2014-06-16 MED ORDER — ONDANSETRON HCL 4 MG/2ML IJ SOLN
INTRAMUSCULAR | Status: AC
  Filled 2014-06-16: qty 2

## 2014-06-16 NOTE — Discharge Instructions (Signed)

## 2014-06-16 NOTE — ED Notes (Signed)
Severe pain L flank, writhing in chair, history of kidney stones

## 2014-06-16 NOTE — ED Provider Notes (Signed)
[facility] Emergency Department Provider Note  ____________________________________________  Time seen: 4 PM  I have reviewed the triage vital signs and the nursing notes.   HISTORY  Chief Complaint Flank Pain      HPI Alan Payne is a 44 y.o. male who presents after an acute onset of left-sided flank pain this morning. He reports this is consistent with kidney stone pain which she has had before. The pain associated makes him nauseous and makes it difficult for him to sit still. No fevers chills. Does notice dark urine. The pain is sharp in nature and 10 out of 10 and nothing seems to make it better     Past Medical History  Diagnosis Date  . OSA on CPAP   . Diabetes mellitus   . Renal disorder   . Kidney stones     Patient Active Problem List   Diagnosis Date Noted  . Epididymitis, right 01/05/2011    Past Surgical History  Procedure Laterality Date  . Vasectomy  2000    Current Outpatient Rx  Name  Route  Sig  Dispense  Refill  . aspirin 81 MG tablet   Oral   Take 81 mg by mouth daily.           . ciprofloxacin (CIPRO) 500 MG tablet   Oral   Take 1 tablet (500 mg total) by mouth 2 (two) times daily.   20 tablet   0   . ibuprofen (ADVIL,MOTRIN) 400 MG tablet   Oral   Take 1 tablet (400 mg total) by mouth every 6 (six) hours as needed for pain.   30 tablet   0   . metFORMIN (GLUCOPHAGE) 500 MG tablet   Oral   Take 500 mg by mouth 2 (two) times daily with a meal.           . oxyCODONE-acetaminophen (PERCOCET) 7.5-325 MG per tablet   Oral   Take 1 tablet by mouth every 4 (four) hours as needed for pain.   30 tablet   0   . saxagliptin HCl (ONGLYZA) 5 MG TABS tablet   Oral   Take 5 mg by mouth daily.          . Tamsulosin HCl (FLOMAX) 0.4 MG CAPS   Oral   Take 1 capsule (0.4 mg total) by mouth daily.   5 capsule   0     Allergies Sitagliptin-metformin hcl  Family History  Problem Relation Age of  Onset  . Kidney cancer Father   . Hypertension Father   . Cancer Father     kidney  . Diabetes Mother     Social History History  Substance Use Topics  . Smoking status: Never Smoker   . Smokeless tobacco: Never Used  . Alcohol Use: No    Review of Systems  Constitutional: Negative for fever. Eyes: Negative for visual changes. ENT: Negative for sore throat. Cardiovascular: Negative for chest pain. Respiratory: Negative for shortness of breath. Gastrointestinal: Positive for flank pain and nausea Genitourinary: Negative for dysuria. Musculoskeletal: Negative for back pain. Skin: Negative for rash. Neurological: Negative for headaches, focal weakness or numbness. Psychiatric: No anxiety  10-point ROS otherwise negative.  ____________________________________________   PHYSICAL EXAM:  VITAL SIGNS: ED Triage Vitals  Enc Vitals Group     BP 06/16/14 1530 155/111 mmHg     Pulse Rate 06/16/14 1530 88     Resp 06/16/14 1530 18     Temp 06/16/14 1530 98.2  F (36.8 C)     Temp Source 06/16/14 1530 Oral     SpO2 06/16/14 1530 99 %     Weight 06/16/14 1530 305 lb (138.347 kg)     Height 06/16/14 1530 6' (1.829 m)     Head Cir --      Peak Flow --      Pain Score 06/16/14 1532 10     Pain Loc --      Pain Edu? --      Excl. in [place]? --      Constitutional: Alert and oriented. Well appearing and in no distress. Patient received IV analgesics while in the waiting room Eyes: Conjunctivae are normal. PERRL. Normal extraocular movements. ENT   Head: Normocephalic and atraumatic.   Nose: No congestion/rhinnorhea.   Mouth/Throat: Mucous membranes are moist.   Neck: No stridor. Hematological/Lymphatic/Immunilogical: No cervical lymphadenopathy. Cardiovascular: Normal rate, regular rhythm. Normal and symmetric distal pulses are present in all extremities. No murmurs, rubs, or gallops. Respiratory: Normal respiratory effort without tachypnea nor retractions.  Breath sounds are clear and equal bilaterally. No wheezes/rales/rhonchi. Gastrointestinal: Soft and nontender. No distention. There is no CVA tenderness. Genitourinary: deferred Musculoskeletal: Nontender with normal range of motion in all extremities. No joint effusions.  No lower extremity tenderness nor edema. Neurologic:  Normal speech and language. No gross focal neurologic deficits are appreciated. Speech is normal.  Skin:  Skin is warm, dry and intact. No rash noted. Psychiatric: Mood and affect are normal. Speech and behavior are normal. Patient exhibits appropriate insight and judgment.  ____________________________________________    LABS (pertinent positives/negatives)  Rare bacteria on urinalysis  ____________________________________________   EKG  None  ____________________________________________    RADIOLOGY  None  ____________________________________________   PROCEDURES  Procedure(s) performed:None  Critical Care performed: None  ____________________________________________   INITIAL IMPRESSION / ASSESSMENT AND PLAN / ED COURSE  Pertinent labs & imaging results that were available during my care of the patient were reviewed by me and considered in my medical decision making (see chart for details).  Patient's presentation consistent with ureterolithiasis. Had significant improvement with IV Toradol. No fevers chills. We'll check urinalysis but suspect patient will be able to be discharged with analgesics and follow-up with urology  Patient's pain significantly improved after IV medications he is ready to be discharged with antibiotics and analgesics ____________________________________________   FINAL CLINICAL IMPRESSION(S) / ED DIAGNOSES  Final diagnoses:  Ureterolithiasis     Lavonia Drafts, MD 06/16/14 2255

## 2014-07-16 ENCOUNTER — Other Ambulatory Visit: Payer: Self-pay | Admitting: Urology

## 2014-07-18 ENCOUNTER — Encounter (HOSPITAL_COMMUNITY): Payer: Self-pay | Admitting: *Deleted

## 2014-07-21 ENCOUNTER — Encounter (HOSPITAL_COMMUNITY)
Admission: RE | Disposition: A | Discharge status: Home / Self Care | Payer: Self-pay | Source: Ambulatory Visit | Attending: Urology

## 2014-07-21 ENCOUNTER — Ambulatory Visit (HOSPITAL_COMMUNITY)
Admission: RE | Admit: 2014-07-21 | Discharge: 2014-07-21 | Disposition: A | Discharge status: Home / Self Care | Payer: BLUE CROSS/BLUE SHIELD | Source: Ambulatory Visit | Attending: Urology | Admitting: Urology

## 2014-07-21 ENCOUNTER — Ambulatory Visit (HOSPITAL_COMMUNITY): Payer: BLUE CROSS/BLUE SHIELD

## 2014-07-21 ENCOUNTER — Encounter (HOSPITAL_COMMUNITY): Payer: Self-pay | Admitting: *Deleted

## 2014-07-21 DIAGNOSIS — N200 Calculus of kidney: Secondary | ICD-10-CM | POA: Diagnosis present

## 2014-07-21 DIAGNOSIS — E119 Type 2 diabetes mellitus without complications: Secondary | ICD-10-CM | POA: Insufficient documentation

## 2014-07-21 DIAGNOSIS — Z791 Long term (current) use of non-steroidal anti-inflammatories (NSAID): Secondary | ICD-10-CM | POA: Diagnosis not present

## 2014-07-21 DIAGNOSIS — G4733 Obstructive sleep apnea (adult) (pediatric): Secondary | ICD-10-CM | POA: Insufficient documentation

## 2014-07-21 DIAGNOSIS — Z79899 Other long term (current) drug therapy: Secondary | ICD-10-CM | POA: Diagnosis not present

## 2014-07-21 DIAGNOSIS — Z7982 Long term (current) use of aspirin: Secondary | ICD-10-CM | POA: Insufficient documentation

## 2014-07-21 DIAGNOSIS — Z79891 Long term (current) use of opiate analgesic: Secondary | ICD-10-CM | POA: Insufficient documentation

## 2014-07-21 DIAGNOSIS — N132 Hydronephrosis with renal and ureteral calculous obstruction: Secondary | ICD-10-CM | POA: Insufficient documentation

## 2014-07-21 DIAGNOSIS — Z87442 Personal history of urinary calculi: Secondary | ICD-10-CM | POA: Diagnosis not present

## 2014-07-21 DIAGNOSIS — N201 Calculus of ureter: Secondary | ICD-10-CM

## 2014-07-21 HISTORY — PX: EXTRACORPOREAL SHOCK WAVE LITHOTRIPSY: SHX1557

## 2014-07-21 LAB — GLUCOSE, CAPILLARY
Glucose-Capillary: 107 mg/dL — ABNORMAL HIGH (ref 65–99)
Glucose-Capillary: 93 mg/dL (ref 65–99)

## 2014-07-21 SURGERY — LITHOTRIPSY, ESWL
Anesthesia: LOCAL | Laterality: Left

## 2014-07-21 MED ORDER — DIAZEPAM 5 MG PO TABS
10.0000 mg | ORAL_TABLET | ORAL | Status: AC
  Administered 2014-07-21: 10 mg via ORAL
  Filled 2014-07-21: qty 2

## 2014-07-21 MED ORDER — SODIUM CHLORIDE 0.9 % IV SOLN
INTRAVENOUS | Status: DC
  Administered 2014-07-21: 07:00:00 via INTRAVENOUS

## 2014-07-21 MED ORDER — DIPHENHYDRAMINE HCL 25 MG PO CAPS
25.0000 mg | ORAL_CAPSULE | ORAL | Status: AC
Start: 2014-07-21 — End: 2014-07-21
  Administered 2014-07-21: 25 mg via ORAL
  Filled 2014-07-21: qty 1

## 2014-07-21 MED ORDER — CIPROFLOXACIN HCL 500 MG PO TABS
500.0000 mg | ORAL_TABLET | ORAL | Status: AC
  Administered 2014-07-21: 500 mg via ORAL
  Filled 2014-07-21: qty 1

## 2014-07-21 NOTE — Op Note (Signed)
See [facility] OP note scanned into chart. 

## 2014-07-21 NOTE — Interval H&P Note (Signed)
History and Physical Interval Note:  07/21/2014 7:25 AM  Alan Payne  has presented today for surgery, with the diagnosis of LEFT DISTAL URETERAL STONE  The various methods of treatment have been discussed with the patient and family. After consideration of risks, benefits and other options for treatment, the patient has consented to  Procedure(s): LEFT EXTRACORPOREAL SHOCK WAVE LITHOTRIPSY (ESWL) (Left) as a surgical intervention .  The patient's history has been reviewed, patient examined, no change in status, stable for surgery.  I have reviewed the patient's chart and labs.  Questions were answered to the patient's satisfaction.     GRAPEY,DAVID S

## 2014-07-21 NOTE — Discharge Instructions (Signed)
See [facility] discharge instructions in chart.  

## 2014-07-21 NOTE — H&P (Signed)
Reason For Visit bilateral nephrolithiasis   History of Present Illness 41M initially seen by Dr. Matilde Sprang for nephrolithiasis. He underwent a CT scan on 06/23/14 demonstrating a large non-obstructing stone burden within the right kidney and a 15mm stone that is in the proximal left ureter with mild hydronephrosis. The stones appear to be uric acid. He has passed my stones, but has never had a metabolic work-up.   Past Medical History Problems  1. History of diabetes mellitus (Z86.39) 2. History of Obstructive Sleep Apnea  Surgical History Problems  1. History of Elective Circumcision 2. History of Surgery Of Male Genitalia Vasectomy  Current Meds 1. Adult Aspirin Low Strength 81 MG TBDP;  Therapy: (Recorded:02Jan2014) to Recorded 2. Ibuprofen 400 MG Oral Tablet;  Therapy: (Recorded:02Jan2014) to Recorded 3. MetFORMIN HCl - 1000 MG Oral Tablet;  Therapy: (Recorded:16May2016) to Recorded 4. Onglyza 5 MG Oral Tablet;  Therapy: (Recorded:02Jan2014) to Recorded 5. Oxycodone-Acetaminophen 7.5-325 MG Oral Tablet;  Therapy: (Recorded:02Jan2014) to Recorded 6. Promethazine HCl - 25 MG Oral Tablet; TAKE 1 TABLET Every  6 hours PRN;  Therapy: [id number] to (Last Rx:16May2016) Ordered 7. Tamsulosin HCl - 0.4 MG Oral Capsule;  Therapy: (Recorded:02Jan2014) to Recorded 8. TraMADol HCl - 50 MG Oral Tablet; TAKE 1-2 TABLETS BY MOUTH EVERY 4-6 HOURS  AS NEEDED;  Therapy: [id number] to (Evaluate:03Jun2016); Last Rx:27May2016 Ordered 9. TraMADol HCl TABS;  Therapy: (Recorded:16May2016) to Recorded  Allergies Medication  1. Janumet XR TB24  Family History Problems  1. Family history of Diabetes Mellitus : Mother 2. Family history of Diabetes Mellitus : Maternal Aunt 3. Family history of Diabetes Mellitus : Maternal Uncle 4. Family history of Hematuria 5. Family history of Hypertension : Father 60. Family history of Kidney Cancer : Father  Social History Problems  1. Denied: History of  Alcohol Use 2. Caffeine Use 3. Marital History - Currently Married 4. Never A Smoker 5. Occupation:   Radio broadcast assistant Vital Signs [Data Includes: Last 1 Day]  Recorded: [id number] 08:47AM  Blood Pressure: 165 / 100 Temperature: 98.5 F Heart Rate: 88  Results/Data Urine [Data Includes: Last 1 Day]   [id number]  COLOR YELLOW   APPEARANCE CLEAR   SPECIFIC GRAVITY 1.020   pH 6.0   GLUCOSE NEG mg/dL  BILIRUBIN NEG   KETONE NEG mg/dL  BLOOD MOD   PROTEIN NEG mg/dL  UROBILINOGEN 0.2 mg/dL  NITRITE NEG   LEUKOCYTE ESTERASE NEG   SQUAMOUS EPITHELIAL/HPF RARE   WBC 0-2 WBC/hpf  RBC 0-2 RBC/hpf  BACTERIA RARE   CRYSTALS NONE SEEN   CASTS NONE SEEN    KUB obtained in clinic today to evaluate patient's ureteral stone location. The renal shadows are visible bilaterally. There may be some calcifications notable right renal pelvis, particularly the lower pole. There is no identifiable calcifications within the left renal pelvis. The previously seen proximal ureteral stone in the left ureter has now advanced down into the pelvis at the level of the sacrum. The expected trajectory of the right ureter demonstrates no calcifications. The gas pattern is grossly normal, there are no appreciable bony abnormalities.  Urinalysis is notable for blood on the dip, negative for microscopic hematuria.   Assessment The patient has bilateral nephrolithiasis, with a left distal ureteral stone which has migrated down into the pelvis. This stone was measured at 7 mm x 8 mm x 5 mm.   Plan  Health Maintenance  1. UA With REFLEX; [Do Not Release]; Status:Complete;   Done: [id number] 08:28AM Nephrolithiasis  2. URINE CULTURE; Status:Hold For - Specimen/Data Collection,Appointment; Requested  TMA:[id number];  3. Follow-up Month x 3 Office  Follow-up with Dr. Lemmie Evens for eval of 24 hr urine and stone  composition.  Status: Hold For - Date of Service  Requested for: 12Sep2016  [employer]; Status:In  Progress - Specimen/Data Collected;  Done: [id number] Perform:[facility]; Due:09Jun2016; Marked Important; Last Updated SK:AJGOTLX, Kirtland Bouchard; 07/14/2014 9:13:14 AM;Ordered;  BWI:[id number]; Ordered MI:WOEHOZY, Marland Kitchen;   Discussion/Summary We discussed several things today in clinic. Our first step is to clear the left ureter stone. His left distal stone is clearly visible on KUB. As such, I think shockwave lithotripsy would be a good modality for him. We did discuss other options including ongoing medical therapy as well as ureteroscopy. Ultimately, the patient has opted for shock wave lithotripsy. I discussed with the patient the procedure in detail as well as the risk and benefits. The patient is aware that she may need additional procedures. She also is aware of the risks of hematoma and pain. We will try to get this patient's scheduled as soon as possible. Following a shockwave procedure, he'll follow up with the nurse practitioner in 3 weeks with a KUB. Once the patient is clear to stone, he will perform a 24-hour urine collection so that we can do a comprehensive metabolic evaluation. I did send one of the stones in the past within the past month to be analyzed today. I will put together a stone composition from today plus his 24-MGNO metabolic evaluation and, but a comprehensive plan. The patient is eager to be stone free and is willing to undergo ureteroscopy on the right if need be. However, I did recommend that we get that metabolic evaluation completed first. Potentially, we could put him on alkalizing therapy given his history of uric acid stones.   Signatures Electronically signed by : Louis Meckel, M.D.; Jul 14 2014  9:16AM EST

## 2014-08-22 ENCOUNTER — Other Ambulatory Visit (HOSPITAL_COMMUNITY): Payer: Self-pay | Admitting: *Deleted

## 2014-08-22 ENCOUNTER — Other Ambulatory Visit: Payer: Self-pay | Admitting: Urology

## 2014-08-22 NOTE — Progress Notes (Signed)
Please release the orders in Epic to sign and held surgery 08-27-14 pre op 08-25-14 Thanks

## 2014-08-25 ENCOUNTER — Inpatient Hospital Stay (HOSPITAL_COMMUNITY): Admission: RE | Admit: 2014-08-25 | Payer: BLUE CROSS/BLUE SHIELD | Source: Ambulatory Visit

## 2014-09-15 DIAGNOSIS — N200 Calculus of kidney: Secondary | ICD-10-CM | POA: Insufficient documentation

## 2014-09-18 ENCOUNTER — Encounter (HOSPITAL_BASED_OUTPATIENT_CLINIC_OR_DEPARTMENT_OTHER): Payer: Self-pay | Admitting: *Deleted

## 2014-09-19 ENCOUNTER — Encounter (HOSPITAL_BASED_OUTPATIENT_CLINIC_OR_DEPARTMENT_OTHER): Payer: Self-pay | Admitting: *Deleted

## 2014-09-19 NOTE — Progress Notes (Signed)
NPO AFTER MN.  ARRIVE AT 0600.  NEEDS ISTAT AND EKG.  MAY TAKE TRAMADOL AM DOS W/ SIPS OF WATER.

## 2014-09-26 ENCOUNTER — Ambulatory Visit (HOSPITAL_BASED_OUTPATIENT_CLINIC_OR_DEPARTMENT_OTHER): Admission: RE | Admit: 2014-09-26 | Payer: BLUE CROSS/BLUE SHIELD | Source: Ambulatory Visit | Admitting: Urology

## 2014-09-26 HISTORY — DX: Type 2 diabetes mellitus without complications: E11.9

## 2014-09-26 HISTORY — DX: Unspecified hydronephrosis: N13.30

## 2014-09-26 HISTORY — DX: Calculus of ureter: N20.1

## 2014-09-26 SURGERY — CYSTOURETEROSCOPY, WITH RETROGRADE PYELOGRAM AND STENT INSERTION
Anesthesia: General | Laterality: Bilateral

## 2015-05-28 ENCOUNTER — Other Ambulatory Visit: Payer: Self-pay | Admitting: Urology

## 2015-05-28 DIAGNOSIS — R109 Unspecified abdominal pain: Secondary | ICD-10-CM

## 2015-05-28 DIAGNOSIS — N200 Calculus of kidney: Secondary | ICD-10-CM

## 2015-05-28 DIAGNOSIS — R3129 Other microscopic hematuria: Secondary | ICD-10-CM

## 2016-02-19 DIAGNOSIS — G4733 Obstructive sleep apnea (adult) (pediatric): Secondary | ICD-10-CM | POA: Insufficient documentation

## 2016-02-19 DIAGNOSIS — E119 Type 2 diabetes mellitus without complications: Secondary | ICD-10-CM | POA: Insufficient documentation

## 2016-02-19 DIAGNOSIS — Z87442 Personal history of urinary calculi: Secondary | ICD-10-CM | POA: Insufficient documentation

## 2016-03-24 ENCOUNTER — Encounter: Payer: Self-pay | Admitting: Urology

## 2016-03-24 ENCOUNTER — Ambulatory Visit (INDEPENDENT_AMBULATORY_CARE_PROVIDER_SITE_OTHER): Payer: Managed Care, Other (non HMO) | Admitting: Urology

## 2016-03-24 VITALS — BP 153/92 | HR 88 | Ht 72.0 in

## 2016-03-24 DIAGNOSIS — Z87442 Personal history of urinary calculi: Secondary | ICD-10-CM

## 2016-03-24 DIAGNOSIS — R972 Elevated prostate specific antigen [PSA]: Secondary | ICD-10-CM

## 2016-03-24 LAB — URINALYSIS, COMPLETE
Bilirubin, UA: NEGATIVE
Glucose, UA: NEGATIVE
Ketones, UA: NEGATIVE
Nitrite, UA: NEGATIVE
Specific Gravity, UA: 1.02 (ref 1.005–1.030)
Urobilinogen, Ur: 0.2 mg/dL (ref 0.2–1.0)
pH, UA: 5.5 (ref 5.0–7.5)

## 2016-03-24 LAB — MICROSCOPIC EXAMINATION: WBC, UA: >30 /hpf — AB (ref 0–?)

## 2016-03-24 NOTE — Progress Notes (Signed)
03/24/2016 2:52 PM   [name] Aug 25, 1970 [id number]  Referring provider: Tracie Harrier, MD [address] [facility] [place], [place] [address]  Chief Complaint  Patient presents with  . Elevated PSA    New Patient    HPI: The patient is a 46 year old gentleman who presents today for evaluation of an elevated PSA.  1. Elevated PSA The patient's PSA was 5.76 in January 2018. Of note, he did have a suspicious urinalysis that was obtained on the same day.  He has not had any prior PSAs and DRE. He has no family history of prostate cancer. He has minimal urinary symptoms. He denies nocturia. He denies urgency, feeling of incomplete bladder emptying, intermittency, and hesitancy.  2. 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 He has had lithotripsy in the past. He believes he has passed approximate 28 stones during his lifetime. He feels he has passed a 6 in the last year. He feels like he is currently passing a stone at this time. He doesn't see stones frequently and saved them at home. His last imaging study was approximately one year ago. He used to follow with Dr. Sherrine Maples in [facility] who is retired. He had a 24-hour urine study in the past and believes it was normal. He is not on any medications for stones.   PMH: Past Medical History:  Diagnosis Date  . Bilateral ureteral calculi   . Hydronephrosis, right   . OSA on CPAP   . Type 2 diabetes mellitus [facility])     Surgical History: Past Surgical History:  Procedure Laterality Date  . CIRCUMCISION  1993  . EXTRACORPOREAL SHOCK WAVE LITHOTRIPSY Left 07-21-2014    Home Medications:  Allergies as of 03/24/2016      Reactions   Sitagliptin-metformin Hcl Itching   Janumet      Medication List       Accurate as of 03/24/16  2:52 PM. Always use your most recent med list.          aspirin 81 MG tablet Take 81 mg by mouth daily.     ibuprofen 200 MG tablet Commonly known as:  ADVIL,MOTRIN Take 600 mg by mouth every 6 (six) hours as needed for moderate pain.   metFORMIN 500 MG tablet Commonly known as:  GLUCOPHAGE Take 1,000 mg by mouth 2 (two) times daily with a meal.   ONGLYZA 5 MG Tabs tablet Generic drug:  saxagliptin HCl Take 5 mg by mouth daily.   traMADol 50 MG tablet Commonly known as:  ULTRAM Take 50 mg by mouth every 6 (six) hours as needed for moderate pain.       Allergies:  Allergies  Allergen Reactions  . Sitagliptin-Metformin Hcl Itching    Janumet    Family History: Family History  Problem Relation Age of Onset  . Kidney cancer Father   . Hypertension Father   . Cancer Father     kidney  . Diabetes Mother   . Prostate cancer Neg Hx     Social History:  reports that he has never smoked. He has never used smokeless tobacco. He reports that he does not drink alcohol or use drugs.  ROS: UROLOGY Frequent Urination?: No Hard to postpone urination?: No Burning/pain with urination?: No Get up at night to urinate?: Yes Leakage of urine?: Yes Urine stream starts and stops?: No Trouble starting stream?: No Do you have to strain to urinate?: No  Blood in urine?: No Urinary tract infection?: No Sexually transmitted disease?: No Injury to kidneys or bladder?: No Painful intercourse?: No Weak stream?: No Erection problems?: No Penile pain?: No  Gastrointestinal Nausea?: No Vomiting?: No Indigestion/heartburn?: No Diarrhea?: No Constipation?: No  Constitutional Fever: No Night sweats?: No Weight loss?: Yes Fatigue?: No  Skin Skin rash/lesions?: No Itching?: No  Eyes Blurred vision?: No Double vision?: No  Ears/Nose/Throat Sore throat?: No Sinus problems?: No  Hematologic/Lymphatic Swollen glands?: No Easy bruising?: No  Cardiovascular Leg swelling?: No Chest pain?: No  Respiratory Cough?: No Shortness of breath?: No  Endocrine Excessive thirst?:  No  Musculoskeletal Back pain?: No Joint pain?: No  Neurological Headaches?: No Dizziness?: No  Psychologic Depression?: No Anxiety?: No  Physical Exam: BP (!) 153/92   Pulse 88   Ht 6' (1.829 m)   Constitutional:  Alert and oriented, No acute distress. HEENT: [place] AT, moist mucus membranes.  Trachea midline, no masses. Cardiovascular: No clubbing, cyanosis, or edema. Respiratory: Normal respiratory effort, no increased work of breathing. GI: Abdomen is soft, nontender, nondistended, no abdominal masses GU: No CVA tenderness. Normal phallus. Testicles descended bilaterally. No masses. Benign. DRE: 1+ smooth benign. Skin: No rashes, bruises or suspicious lesions. Lymph: No cervical or inguinal adenopathy. Neurologic: Grossly intact, no focal deficits, moving all 4 extremities. Psychiatric: Normal mood and affect.  Laboratory Data: Lab Results  Component Value Date   WBC 16.1 (H) 03/25/2012   HGB 14.8 03/25/2012   HCT 42.9 03/25/2012   MCV 82.7 03/25/2012   PLT 214 03/25/2012    Lab Results  Component Value Date   CREATININE 0.80 03/25/2012    No results found for: PSA  No results found for: TESTOSTERONE  Lab Results  Component Value Date   HGBA1C (H) 01/14/2008    9.4 (NOTE)   The ADA recommends the following therapeutic goal for glycemic   control related to Hgb A1C measurement:   Goal of Therapy:   < 7.0% Hgb A1C   Reference: American Diabetes Association: Clinical Practice   Recommendations 2008, Diabetes Care,  2008, 31:(Suppl 1).    Urinalysis    Component Value Date/Time   COLORURINE YELLOW (A) 06/16/2014 1730   APPEARANCEUR CLOUDY (A) 06/16/2014 1730   LABSPEC 1.015 06/16/2014 1730   PHURINE 5.0 06/16/2014 1730   GLUCOSEU NEGATIVE 06/16/2014 1730   HGBUR 3+ (A) 06/16/2014 1730   BILIRUBINUR NEGATIVE 06/16/2014 1730   KETONESUR NEGATIVE 06/16/2014 1730   PROTEINUR 30 (A) 06/16/2014 1730   UROBILINOGEN 1.0 03/25/2012 1458   NITRITE NEGATIVE  06/16/2014 1730   LEUKOCYTESUR TRACE (A) 06/16/2014 1730     Assessment & Plan:    1. Elevated PSA I did discuss with the patient that his PSA is elevated especially for his young age. We discussed sources of PSA elevation including UTI, BPH, and prostate cancer. We did discuss his urinalysis was suspicious for a UTI at the time it was drawn. Though prostate cancer is possible in a 46 year old, it is not very common. I suspect that his PSA may be elevated due to subclinical prostatitis. I recommended for him to undergo an extended course of oral antibiotics for 4 weeks and then recheck his PSA in 6-8 weeks from now. The patient is wary about being placed on antibiotics as he does not like to take medication. We will send his urine for culture today as it is again suspicious for a possible UTI. If this is positive, will start him on a long course  of antibiotics to treat prostatitis. We will plan to recheck his PSA in 8 weeks.  2. Uric acid nephrolithiasis As the patient feels that he is currently passing a stone, I have offered for him to undergo a CT scan this time. He is not interested in undergoing imaging at this time. We will obtain his medical records from his previous urologist. The patient's pain does worsen though he will call the office and we will order a CT stone protocol to further assess. After review of his previous urology records, he may benefit from a discussion regarding starting potassium citrate for his recurrent uric acid nephrolithiasis. His urine pH today is currently 5.5.  Return in about 8 weeks (around 05/19/2016) for PSA prior.  Nickie Retort, MD  [facility] [address], [place], [place] [address] [phone]

## 2016-03-27 LAB — CULTURE, URINE COMPREHENSIVE

## 2016-03-28 ENCOUNTER — Telehealth: Payer: Self-pay

## 2016-03-28 MED ORDER — AMOXICILLIN-POT CLAVULANATE 875-125 MG PO TABS: 1.0000 | ORAL_TABLET | Freq: Two times a day (BID) | ORAL | 0 refills | Status: DC

## 2016-03-28 NOTE — Telephone Encounter (Signed)
Per Zara Council, PAC patient needs to be notified that urine culture was positive for infection and a script was sent to his pharmacy.   Left patient a message to call office

## 2016-03-29 NOTE — Telephone Encounter (Signed)
Spoke with patient and gave him his results and let him know about his ABX.  michelle

## 2016-04-13 ENCOUNTER — Other Ambulatory Visit: Payer: Self-pay

## 2016-05-17 ENCOUNTER — Other Ambulatory Visit: Payer: Managed Care, Other (non HMO)

## 2016-05-17 DIAGNOSIS — R972 Elevated prostate specific antigen [PSA]: Secondary | ICD-10-CM

## 2016-05-18 LAB — PSA TOTAL (REFLEX TO FREE): Prostate Specific Ag, Serum: 0.4 ng/mL (ref 0.0–4.0)

## 2016-05-19 ENCOUNTER — Ambulatory Visit: Payer: Managed Care, Other (non HMO)

## 2016-05-26 ENCOUNTER — Ambulatory Visit (INDEPENDENT_AMBULATORY_CARE_PROVIDER_SITE_OTHER): Payer: Managed Care, Other (non HMO) | Admitting: Urology

## 2016-05-26 ENCOUNTER — Encounter: Payer: Self-pay | Admitting: Urology

## 2016-05-26 DIAGNOSIS — N200 Calculus of kidney: Secondary | ICD-10-CM

## 2016-05-26 DIAGNOSIS — R972 Elevated prostate specific antigen [PSA]: Secondary | ICD-10-CM | POA: Diagnosis not present

## 2016-05-26 NOTE — Progress Notes (Signed)
05/26/2016 10:34 AM   Alan Payne 01-23-1971 [id number]  Referring provider: Tracie Harrier, MD [address] [facility] [place], [place] [address]  Chief Complaint  Patient presents with  . Elevated PSA    HPI: The patient is a 46 year old gentleman who presents today for evaluation of an elevated PSA.  1. Elevated PSA The patient's PSA was 5.76 in January 2018. At that time, his urinalysis was suspicious for infection. His appointment in our office in February 2015, he had a positive urine culture with Escherichia coli. History with a course of antibiotics. He returns today with a repeat PSA which is now down to 0.4.  2. 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 He has had lithotripsy in the past. He believes he has passed approximate 28 stones during his lifetime. He feels he has passed a 6 in the last year.  His last imaging study was approximately one year ago. He used to follow with Dr. Sherrine Maples in [facility] who is retired. He had a 24-hour urine study in the past and believes it was normal. He is not on any medications for stones.     PMH: Past Medical History:  Diagnosis Date  . Bilateral ureteral calculi   . Hydronephrosis, right   . OSA on CPAP   . Type 2 diabetes mellitus [facility])     Surgical History: Past Surgical History:  Procedure Laterality Date  . CIRCUMCISION  1993  . EXTRACORPOREAL SHOCK WAVE LITHOTRIPSY Left 07-21-2014    Home Medications:  Allergies as of 05/26/2016      Reactions   Sitagliptin-metformin Hcl Itching   Janumet      Medication List       Accurate as of 05/26/16 10:34 AM. Always use your most recent med list.          aspirin 81 MG tablet Take 81 mg by mouth daily.   ibuprofen 200 MG tablet Commonly known as:  ADVIL,MOTRIN Take 600 mg by mouth every 6 (six) hours as needed for moderate pain.   metFORMIN 500 MG tablet Commonly known as:   GLUCOPHAGE Take 1,000 mg by mouth 2 (two) times daily with a meal.   ONGLYZA 5 MG Tabs tablet Generic drug:  saxagliptin HCl Take 5 mg by mouth daily.   traMADol 50 MG tablet Commonly known as:  ULTRAM Take 50 mg by mouth every 6 (six) hours as needed for moderate pain.       Allergies:  Allergies  Allergen Reactions  . Sitagliptin-Metformin Hcl Itching    Janumet    Family History: Family History  Problem Relation Age of Onset  . Kidney cancer Father   . Hypertension Father   . Cancer Father     kidney  . Diabetes Mother   . Prostate cancer Neg Hx     Social History:  reports that he has never smoked. He has never used smokeless tobacco. He reports that he does not drink alcohol or use drugs.  ROS: UROLOGY Frequent Urination?: No Hard to postpone urination?: No Burning/pain with urination?: No Get up at night to urinate?: No Leakage of urine?: No Urine stream starts and stops?: No Trouble starting stream?: No Do you have to strain to urinate?: No Blood in urine?: No Urinary tract infection?: No Sexually transmitted disease?: No Injury to kidneys or bladder?: No Painful intercourse?: No Weak stream?: No Erection problems?: No Penile pain?: No  Gastrointestinal Nausea?:  No Vomiting?: No Indigestion/heartburn?: No Diarrhea?: No Constipation?: No  Constitutional Fever: No Night sweats?: No Weight loss?: No Fatigue?: No  Skin Skin rash/lesions?: No Itching?: No  Eyes Blurred vision?: No Double vision?: No  Ears/Nose/Throat Sore throat?: No Sinus problems?: No  Hematologic/Lymphatic Swollen glands?: No Easy bruising?: No  Cardiovascular Leg swelling?: No Chest pain?: No  Respiratory Cough?: No Shortness of breath?: No  Endocrine Excessive thirst?: No  Musculoskeletal Back pain?: No Joint pain?: No  Neurological Headaches?: No Dizziness?: No  Psychologic Depression?: No Anxiety?: No  Physical Exam: There were no  vitals taken for this visit.  Constitutional:  Alert and oriented, No acute distress. HEENT: [place] AT, moist mucus membranes.  Trachea midline, no masses. Cardiovascular: No clubbing, cyanosis, or edema. Respiratory: Normal respiratory effort, no increased work of breathing. GI: Abdomen is soft, nontender, nondistended, no abdominal masses GU: No CVA tenderness.  Skin: No rashes, bruises or suspicious lesions. Lymph: No cervical or inguinal adenopathy. Neurologic: Grossly intact, no focal deficits, moving all 4 extremities. Psychiatric: Normal mood and affect.  Laboratory Data: Lab Results  Component Value Date   WBC 16.1 (H) 03/25/2012   HGB 14.8 03/25/2012   HCT 42.9 03/25/2012   MCV 82.7 03/25/2012   PLT 214 03/25/2012    Lab Results  Component Value Date   CREATININE 0.80 03/25/2012    No results found for: PSA  No results found for: TESTOSTERONE  Lab Results  Component Value Date   HGBA1C (H) 01/14/2008    9.4 (NOTE)   The ADA recommends the following therapeutic goal for glycemic   control related to Hgb A1C measurement:   Goal of Therapy:   < 7.0% Hgb A1C   Reference: American Diabetes Association: Clinical Practice   Recommendations 2008, Diabetes Care,  2008, 31:(Suppl 1).    Urinalysis    Component Value Date/Time   COLORURINE YELLOW (A) 06/16/2014 1730   APPEARANCEUR Cloudy (A) 03/24/2016 1445   LABSPEC 1.015 06/16/2014 1730   PHURINE 5.0 06/16/2014 1730   GLUCOSEU Negative 03/24/2016 1445   HGBUR 3+ (A) 06/16/2014 1730   BILIRUBINUR Negative 03/24/2016 1445   KETONESUR NEGATIVE 06/16/2014 1730   PROTEINUR 2+ (A) 03/24/2016 1445   PROTEINUR 30 (A) 06/16/2014 1730   UROBILINOGEN 1.0 03/25/2012 1458   NITRITE Negative 03/24/2016 1445   NITRITE NEGATIVE 06/16/2014 1730   LEUKOCYTESUR 3+ (A) 03/24/2016 1445     Assessment & Plan:    1. History of elevated PSA This was undoubtedly related to his infected urinary status. His since normalized. He can  return to standard screening by his primary care provider.  2. History of uric acid nephrolithiasis The patient will contact the office if he believes is passing a stone in the future.   Return if symptoms worsen or fail to improve.  Nickie Retort, MD  [facility] [address], [place], [place] [address] [phone]

## 2016-11-24 ENCOUNTER — Ambulatory Visit: Payer: 59 | Attending: Neurology

## 2016-11-24 DIAGNOSIS — G4733 Obstructive sleep apnea (adult) (pediatric): Secondary | ICD-10-CM | POA: Insufficient documentation

## 2016-11-24 DIAGNOSIS — R400 Somnolence: Secondary | ICD-10-CM | POA: Insufficient documentation

## 2017-03-16 ENCOUNTER — Encounter: Payer: Self-pay | Admitting: Family Medicine

## 2017-03-16 ENCOUNTER — Ambulatory Visit: Payer: 59 | Admitting: Family Medicine

## 2017-03-16 VITALS — BP 134/82 | HR 95 | Temp 98.5°F | Ht 72.0 in | Wt 298.8 lb

## 2017-03-16 DIAGNOSIS — E119 Type 2 diabetes mellitus without complications: Secondary | ICD-10-CM | POA: Diagnosis not present

## 2017-03-16 LAB — POCT GLYCOSYLATED HEMOGLOBIN (HGB A1C): Hemoglobin A1C: 7.4

## 2017-03-16 NOTE — Assessment & Plan Note (Signed)
A1c improved to 7.4 based on his last reported A1c of 11. Congratulated patient on his weight loss and improvement in his A1c. Will continue current dose of metformin and saxagliptin. Follow up in 3-6 months to repeat A1c.

## 2017-03-16 NOTE — Progress Notes (Signed)
Subjective:  Alan Payne is a 47 y.o. male who presents today with a chief complaint of T2DM and to establish care  HPI:  T2DM, New Problem Several year history.  Current medications include metformin 750 mg twice daily as well as saxagliptin 5 mg daily.  He is tolerating both these medications without any obvious side effects.  He has been working on lifestyle modifications.Lost 10 pounds over the last several weeks to months.  His last A1c was 11.4 about 6 months ago.  He thinks that his change in diet and exercise have helped control his blood sugars.  Overall, thinks that his sugars are improving.  No polyuria or polydipsia.  No hypoglycemic symptoms.  No diarrhea.  ROS: Per HPI, otherwise a 10 point review of systems was performed and was negative  PMH:  The following were reviewed and entered/updated in epic: Past Medical History:  Diagnosis Date  . Bilateral ureteral calculi   . Hydronephrosis, right   . OSA on CPAP   . Type 2 diabetes mellitus [facility])    Patient Active Problem List   Diagnosis Date Noted  . Morbid obesity ([place]) 03/16/2017  . History of kidney stones 02/19/2016  . OSA (obstructive sleep apnea) 02/19/2016  . Type 2 diabetes mellitus without complication, without long-term current use of insulin ([place]) 02/19/2016  . Recurrent nephrolithiasis 09/15/2014  . Epididymitis, right 01/05/2011   Past Surgical History:  Procedure Laterality Date  . CIRCUMCISION  1993  . EXTRACORPOREAL SHOCK WAVE LITHOTRIPSY Left 07-21-2014   Family History  Problem Relation Age of Onset  . Kidney cancer Father   . Hypertension Father   . Cancer Father        kidney  . Diabetes Mother   . Prostate cancer Neg Hx     Medications- reviewed and updated Current Outpatient Medications  Medication Sig Dispense Refill  . aspirin 81 MG tablet Take 81 mg by mouth daily.      . metFORMIN (GLUCOPHAGE) 500 MG tablet Take 750 mg by mouth 2 (two) times daily with a meal.     .  Multiple Vitamin (MULTIVITAMIN) tablet Take 1 tablet by mouth daily.    . saxagliptin HCl (ONGLYZA) 5 MG TABS tablet Take 5 mg by mouth daily.      No current facility-administered medications for this visit.    Allergies-reviewed and updated Allergies  Allergen Reactions  . Sitagliptin-Metformin Hcl Itching    Janumet   Social History   Socioeconomic History  . Marital status: Married    Spouse name: None  . Number of children: 2  . Years of education: None  . Highest education level: None  Social Needs  . Financial resource strain: None  . Food insecurity - worry: None  . Food insecurity - inability: None  . Transportation needs - medical: None  . Transportation needs - non-medical: None  Occupational History  . None  Tobacco Use  . Smoking status: Never Smoker  . Smokeless tobacco: Never Used  Substance and Sexual Activity  . Alcohol use: Yes    Alcohol/week: 0.6 oz    Types: 1 Cans of beer per week  . Drug use: No  . Sexual activity: Yes    Partners: Female  Other Topics Concern  . None  Social History Narrative  . None   Objective:  Physical Exam: BP 134/82 (BP Location: Left Arm, Patient Position: Sitting, Cuff Size: Normal)   Pulse 95   Temp 98.5 F (36.9 C) (  Oral)   Ht 6' (1.829 m)   Wt 298 lb 12.8 oz (135.5 kg)   SpO2 98%   BMI 40.52 kg/m   Gen: NAD, resting comfortably CV: RRR with no murmurs appreciated Pulm: NWOB, CTAB with no crackles, wheezes, or rhonchi GI: Normal bowel sounds present. Soft, Nontender, Nondistended. MSK: No edema, cyanosis, or clubbing noted Skin: Warm, dry Neuro: Grossly normal, moves all extremities Psych: Normal affect and thought content  Results for orders placed or performed in visit on 03/16/17 (from the past 24 hour(s))  POCT glycosylated hemoglobin (Hb A1C)     Status: None   Collection Time: 03/16/17 10:14 AM  Result Value Ref Range   Hemoglobin A1C 7.4    Assessment/Plan:  Type 2 diabetes mellitus without  complication, without long-term current use of insulin (HCC) A1c improved to 7.4 based on his last reported A1c of 11. Congratulated patient on his weight loss and improvement in his A1c. Will continue current dose of metformin and saxagliptin. Follow up in 3-6 months to repeat A1c.  Morbid obesity (HCC) BMI 40.52. Encouraged patient to continue lifestyle modifications.   Preventative Healthcare Patient will return soon for CPE.   Algis Greenhouse. Jerline Pain, MD 03/16/2017 11:42 AM

## 2017-03-16 NOTE — Assessment & Plan Note (Signed)
BMI 40.52. Encouraged patient to continue lifestyle modifications.

## 2017-03-16 NOTE — Patient Instructions (Signed)
Your A1c today is 7.4  Continue the good work.  Please come back soon for your annual physical. We can check your screening blood work at that time.  I would like to have you come back for an office visit to recheck your A1c and discuss your diabetes in 3-6 months.  Take care, Dr Jerline Pain

## 2017-05-17 ENCOUNTER — Ambulatory Visit: Payer: Self-pay | Admitting: *Deleted

## 2017-05-17 NOTE — Telephone Encounter (Signed)
Pt  Reports  Symptoms  Of the  Pain /  Tingling  And  The  Numbness  When   He sleeps . Pain is  Reported  As  Mild. Appointment  Made  For tomorrow  With Dr  Jerline Pain   Reason for Disposition . [1] MILD pain (e.g., does not interfere with normal activities) AND [2] present > 7 days  Answer Assessment - Initial Assessment Questions 1. ONSET: "When did the pain start?"       Approx   1  Week    2. LOCATION: "Where is the pain located?"      R leg  From knee to hamstring in back   3. PAIN: "How bad is the pain?"    (Scale 1-10; or mild, moderate, severe)   -  MILD (1-3): doesn't interfere with normal activities    -  MODERATE (4-7): interferes with normal activities (e.g., work or school) or awakens from sleep, limping    -  SEVERE (8-10): excruciating pain, unable to do any normal activities, unable to walk      Mild  4. WORK OR EXERCISE: "Has there been any recent work or exercise that involved this part of the body?"        Has been using  Stationary bike  Prior to  Symptoms   5. CAUSE: "What do you think is causing the leg pain?"        Unknown   6. OTHER SYMPTOMS: "Do you have any other symptoms?" (e.g., chest pain, back pain, breathing difficulty, swelling, rash, fever, numbness, weakness)      Numbness  And  Tingling  Same  Area   With  A  Burning  Sensation   7. PREGNANCY: "Is there any chance you are pregnant?" "When was your last menstrual period?"     n/a  Protocols used: LEG PAIN-A-AH

## 2017-05-18 ENCOUNTER — Ambulatory Visit: Payer: 59 | Admitting: Family Medicine

## 2017-05-18 ENCOUNTER — Encounter: Payer: Self-pay | Admitting: Family Medicine

## 2017-05-18 VITALS — BP 136/80 | HR 78 | Temp 98.5°F | Resp 18 | Ht 72.0 in | Wt 299.0 lb

## 2017-05-18 DIAGNOSIS — G5711 Meralgia paresthetica, right lower limb: Secondary | ICD-10-CM

## 2017-05-18 NOTE — Patient Instructions (Signed)
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  This is a benign condition that is caused by the nerve on your side getting irritated.  You can take motrin or naproxen as needed.  Try loosening your belt to see if this helps.  I will see you back in a few weeks.  Take care, Dr Jerline Pain

## 2017-05-18 NOTE — Progress Notes (Signed)
    Subjective:  Alan Payne is a 47 y.o. male who presents today for same-day appointment with a chief complaint of leg pain.   HPI:  Leg Pain, New Problem Patient with intermittent symptoms for the past several years.  Symptoms are mostly located along the lateral aspect of his right upper leg.  Symptoms usually only occur at night when he is laying down in bed.  Describes a sensation as a numbness and tingling sensation that occasionally feels warm.  By the time he wakes up in the morning, that sensation has resolved.  No obvious precipitating events.  No history of trauma.  Symptoms have worsened over the last few weeks, and have become more consistent.  Symptoms are better when he lays on his left side.  He has lost quite a bit of weight recently and has been tightening his belt more than he did previously.  ROS: Per HPI  Objective:  Physical Exam: BP 136/80 (BP Location: Left Arm)   Pulse 78   Temp 98.5 F (36.9 C)   Resp 18   Ht 6' (1.829 m)   Wt 299 lb (135.6 kg)   SpO2 98%   BMI 40.55 kg/m   Gen: NAD, resting comfortably MSK: -Right leg: No deformities.  Strength 5 out of 5 in all directions.  Sensation to light touch intact.  Neurovascularly intact distally.  Assessment/Plan:  Meralgia paresthetica No red flag signs or symptoms.  Normal neurological exam today.  Discussed etiology of patient's symptoms.  Advised him to try loosening his belt to see if this helps.  Also recommended other conservative measures including weight loss and avoiding putting pressure on his right hip.  Recommended use of Motrin or naproxen as needed at night if symptoms are severe.  Return precautions reviewed.  He will follow-up with me in a few weeks for his next office visit.  Algis Greenhouse. Jerline Pain, MD 05/18/2017 9:22 AM

## 2017-06-12 ENCOUNTER — Encounter: Payer: Self-pay | Admitting: Family Medicine

## 2017-06-12 ENCOUNTER — Ambulatory Visit: Payer: 59 | Admitting: Family Medicine

## 2017-06-12 VITALS — BP 140/90 | HR 73 | Temp 97.7°F | Resp 12 | Ht 72.0 in | Wt 299.0 lb

## 2017-06-12 DIAGNOSIS — R030 Elevated blood-pressure reading, without diagnosis of hypertension: Secondary | ICD-10-CM

## 2017-06-12 DIAGNOSIS — Z23 Encounter for immunization: Secondary | ICD-10-CM

## 2017-06-12 DIAGNOSIS — Z13220 Encounter for screening for lipoid disorders: Secondary | ICD-10-CM

## 2017-06-12 DIAGNOSIS — E119 Type 2 diabetes mellitus without complications: Secondary | ICD-10-CM

## 2017-06-12 DIAGNOSIS — Z0001 Encounter for general adult medical examination with abnormal findings: Secondary | ICD-10-CM

## 2017-06-12 LAB — COMPREHENSIVE METABOLIC PANEL
ALT: 20 U/L (ref 0–53)
AST: 19 U/L (ref 0–37)
Albumin: 4 g/dL (ref 3.5–5.2)
Alkaline Phosphatase: 57 U/L (ref 39–117)
BUN: 13 mg/dL (ref 6–23)
CO2: 23 mEq/L (ref 19–32)
Calcium: 9.3 mg/dL (ref 8.4–10.5)
Chloride: 106 mEq/L (ref 96–112)
Creatinine, Ser: 1.08 mg/dL (ref 0.40–1.50)
GFR: 94.38 mL/min (ref >60.00)
Glucose, Bld: 120 mg/dL — ABNORMAL HIGH (ref 70–99)
Potassium: 3.9 mEq/L (ref 3.5–5.1)
Sodium: 138 mEq/L (ref 135–145)
Total Bilirubin: 0.3 mg/dL (ref 0.2–1.2)
Total Protein: 7.3 g/dL (ref 6.0–8.3)

## 2017-06-12 LAB — LIPID PANEL
Cholesterol: 134 mg/dL (ref 0–200)
HDL: 21.7 mg/dL — ABNORMAL LOW (ref >39.00)
NonHDL: 112.69
Total CHOL/HDL Ratio: 6
Triglycerides: 339 mg/dL — ABNORMAL HIGH (ref 0.0–149.0)
VLDL: 67.8 mg/dL — ABNORMAL HIGH (ref 0.0–40.0)

## 2017-06-12 LAB — TSH: TSH: 2.13 u[IU]/mL (ref 0.35–4.50)

## 2017-06-12 LAB — CBC
HCT: 46.8 % (ref 39.0–52.0)
Hemoglobin: 15.5 g/dL (ref 13.0–17.0)
MCHC: 33.2 g/dL (ref 30.0–36.0)
MCV: 84.7 fl (ref 78.0–100.0)
Platelets: 242 10*3/uL (ref 150.0–400.0)
RBC: 5.52 Mil/uL (ref 4.22–5.81)
RDW: 14.8 % (ref 11.5–15.5)
WBC: 7.8 10*3/uL (ref 4.0–10.5)

## 2017-06-12 LAB — LDL CHOLESTEROL, DIRECT: Direct LDL: 55 mg/dL

## 2017-06-12 NOTE — Patient Instructions (Signed)
Preventive Care 40-64 Years, Male Preventive care refers to lifestyle choices and visits with your health care provider that can promote health and wellness. What does preventive care include?  A yearly physical exam. This is also called an annual well check.  Dental exams once or twice a year.  Routine eye exams. Ask your health care provider how often you should have your eyes checked.  Personal lifestyle choices, including: ? Daily care of your teeth and gums. ? Regular physical activity. ? Eating a healthy diet. ? Avoiding tobacco and drug use. ? Limiting alcohol use. ? Practicing safe sex. ? Taking low-dose aspirin every day starting at age 47. What happens during an annual well check? The services and screenings done by your health care provider during your annual well check will depend on your age, overall health, lifestyle risk factors, and family history of disease. Counseling Your health care provider may ask you questions about your:  Alcohol use.  Tobacco use.  Drug use.  Emotional well-being.  Home and relationship well-being.  Sexual activity.  Eating habits.  Work and work Statistician.  Screening You may have the following tests or measurements:  Height, weight, and BMI.  Blood pressure.  Lipid and cholesterol levels. These may be checked every 5 years, or more frequently if you are over 76 years old.  Skin check.  Lung cancer screening. You may have this screening every year starting at age 47 if you have a 30-pack-year history of smoking and currently smoke or have quit within the past 15 years.  Fecal occult blood test (FOBT) of the stool. You may have this test every year starting at age 47.  Flexible sigmoidoscopy or colonoscopy. You may have a sigmoidoscopy every 5 years or a colonoscopy every 10 years starting at age 47.  Prostate cancer screening. Recommendations will vary depending on your family history and other risks.  Hepatitis C  blood test.  Hepatitis B blood test.  Sexually transmitted disease (STD) testing.  Diabetes screening. This is done by checking your blood sugar (glucose) after you have not eaten for a while (fasting). You may have this done every 1-3 years.  Discuss your test results, treatment options, and if necessary, the need for more tests with your health care provider. Vaccines Your health care provider may recommend certain vaccines, such as:  Influenza vaccine. This is recommended every year.  Tetanus, diphtheria, and acellular pertussis (Tdap, Td) vaccine. You may need a Td booster every 10 years.  Varicella vaccine. You may need this if you have not been vaccinated.  Zoster vaccine. You may need this after age 47.  Measles, mumps, and rubella (MMR) vaccine. You may need at least one dose of MMR if you were born in 1957 or later. You may also need a second dose.  Pneumococcal 13-valent conjugate (PCV13) vaccine. You may need this if you have certain conditions and have not been vaccinated.  Pneumococcal polysaccharide (PPSV23) vaccine. You may need one or two doses if you smoke cigarettes or if you have certain conditions.  Meningococcal vaccine. You may need this if you have certain conditions.  Hepatitis A vaccine. You may need this if you have certain conditions or if you travel or work in places where you may be exposed to hepatitis A.  Hepatitis B vaccine. You may need this if you have certain conditions or if you travel or work in places where you may be exposed to hepatitis B.  Haemophilus influenzae type b (Hib) vaccine.  You may need this if you have certain risk factors.  Talk to your health care provider about which screenings and vaccines you need and how often you need them. This information is not intended to replace advice given to you by your health care provider. Make sure you discuss any questions you have with your health care provider. Document Released: 02/20/2015  Document Revised: 10/14/2015 Document Reviewed: 11/25/2014 Elsevier Interactive Patient Education  [employer].

## 2017-06-12 NOTE — Progress Notes (Signed)
Subjective:  Alan Payne is a 47 y.o. male who presents today for his annual comprehensive physical exam.    HPI:  He has no acute complaints today.   Lifestyle Diet: No specific diets. Working on [employer].  Exercise: Go to gym every day. Works on cardio.   Depression screen PHQ 2/9 06/12/2017  Decreased Interest 0  Down, Depressed, Hopeless 0  PHQ - 2 Score 0   Health Maintenance Due  Topic Date Due  . TETANUS/TDAP  10/05/1989  . FOOT EXAM  02/18/2017     ROS: Per HPI, otherwise a complete review of systems was negative.   PMH:  The following were reviewed and entered/updated in epic: Past Medical History:  Diagnosis Date  . Bilateral ureteral calculi   . Hydronephrosis, right   . OSA on CPAP   . Type 2 diabetes mellitus [facility])    Patient Active Problem List   Diagnosis Date Noted  . Morbid obesity ([place]) 03/16/2017  . History of kidney stones 02/19/2016  . OSA (obstructive sleep apnea) 02/19/2016  . Type 2 diabetes mellitus without complication, without long-term current use of insulin ([place]) 02/19/2016   Past Surgical History:  Procedure Laterality Date  . CIRCUMCISION  1993  . EXTRACORPOREAL SHOCK WAVE LITHOTRIPSY Left 07-21-2014    Family History  Problem Relation Age of Onset  . Kidney cancer Father   . Hypertension Father   . Cancer Father        kidney  . Diabetes Mother   . Prostate cancer Neg Hx     Medications- reviewed and updated Current Outpatient Medications  Medication Sig Dispense Refill  . aspirin 81 MG tablet Take 81 mg by mouth daily.      . Ginkgo Biloba 40 MG TABS Take by mouth.    . metFORMIN (GLUCOPHAGE-XR) 750 MG 24 hr tablet Take 750 mg by mouth 2 (two) times daily.    . Multiple Vitamin (MULTIVITAMIN) tablet Take 1 tablet by mouth daily.    . saxagliptin HCl (ONGLYZA) 5 MG TABS tablet Take 5 mg by mouth daily.      No current facility-administered medications for this visit.     Allergies-reviewed and  updated Allergies  Allergen Reactions  . Sitagliptin-Metformin Hcl Itching    Janumet    Social History   Socioeconomic History  . Marital status: Married    Spouse name: Not on file  . Number of children: 2  . Years of education: Not on file  . Highest education level: Not on file  Occupational History  . Not on file  Social Needs  . Financial resource strain: Not on file  . Food insecurity:    Worry: Not on file    Inability: Not on file  . Transportation needs:    Medical: Not on file    Non-medical: Not on file  Tobacco Use  . Smoking status: Never Smoker  . Smokeless tobacco: Never Used  Substance and Sexual Activity  . Alcohol use: Yes    Alcohol/week: 0.6 oz    Types: 1 Cans of beer per week  . Drug use: No  . Sexual activity: Yes    Partners: Female  Lifestyle  . Physical activity:    Days per week: Not on file    Minutes per session: Not on file  . Stress: Not on file  Relationships  . Social connections:    Talks on phone: Not on file    Gets together: Not on file  Attends religious service: Not on file    Active member of club or organization: Not on file    Attends meetings of clubs or organizations: Not on file    Relationship status: Not on file  Other Topics Concern  . Not on file  Social History Narrative  . Not on file    Objective:  Physical Exam: BP 140/90   Pulse 73   Temp 97.7 F (36.5 C)   Resp 12   Ht 6' (1.829 m)   Wt 299 lb (135.6 kg)   SpO2 99%   BMI 40.55 kg/m   Body mass index is 40.55 kg/m. Wt Readings from Last 3 Encounters:  06/12/17 299 lb (135.6 kg)  05/18/17 299 lb (135.6 kg)  03/16/17 298 lb 12.8 oz (135.5 kg)  Gen: NAD, resting comfortably HEENT: TMs normal bilaterally. OP clear. No thyromegaly noted.  CV: RRR with no murmurs appreciated Pulm: NWOB, CTAB with no crackles, wheezes, or rhonchi GI: Normal bowel sounds present. Soft, Nontender, Nondistended. MSK: no edema, cyanosis, or clubbing noted. Foot  exam performed today without abnormality - see flowsheet for details.  Skin: warm, dry Neuro: CN2-12 grossly intact. Strength 5/5 in upper and lower extremities. Reflexes symmetric and intact bilaterally.  Psych: Normal affect and thought content  Assessment/Plan:  Type 2 diabetes mellitus without complication, without long-term current use of insulin (HCC) Pneumonia vaccine given today.  Foot exam performed today.  Continue Onglyza and metformin.  He will follow-up with the next 1 to 3 months for repeat A1c.  Morbid obesity ([place]) Patient's weight and BMI are stable.  Discussed lifestyle modifications.  Elevated blood pressure reading Borderline today.  He will follow-up within the next couple of months.  Discussed home blood pressure monitoring with goal 140/90 or lower.  Preventative Healthcare: Check lipid panel.  Pneumonia shot given today.  Foot exam performed today.  Patient Counseling(The following topics were reviewed and/or handout was given):  -Nutrition: Stressed importance of moderation in sodium/caffeine intake, saturated fat and cholesterol, caloric balance, sufficient intake of fresh fruits, vegetables, and fiber.  -Stressed the importance of regular exercise.   -Substance Abuse: Discussed cessation/primary prevention of tobacco, alcohol, or other drug use; driving or other dangerous activities under the influence; availability of treatment for abuse.   -Injury prevention: Discussed safety belts, safety helmets, smoke detector, smoking near bedding or upholstery.   -Sexuality: Discussed sexually transmitted diseases, partner selection, use of condoms, avoidance of unintended pregnancy and contraceptive alternatives.   -Dental health: Discussed importance of regular tooth brushing, flossing, and dental visits.  -Health maintenance and immunizations reviewed. Please refer to Health maintenance section.  Return to care in 1 year for next preventative visit.   Algis Greenhouse. Jerline Pain,  MD 06/12/2017 9:48 AM

## 2017-06-12 NOTE — Assessment & Plan Note (Signed)
Pneumonia vaccine given today.  Foot exam performed today.  Continue Onglyza and metformin.  He will follow-up with the next 1 to 3 months for repeat A1c.

## 2017-06-12 NOTE — Assessment & Plan Note (Signed)
Patient's weight and BMI are stable.  Discussed lifestyle modifications.

## 2017-06-13 ENCOUNTER — Telehealth: Payer: Self-pay | Admitting: Family Medicine

## 2017-06-13 NOTE — Telephone Encounter (Signed)
Contact patient to schedule an office visit to re-check A1C

## 2017-06-13 NOTE — Telephone Encounter (Signed)
scheduled

## 2017-06-13 NOTE — Telephone Encounter (Signed)
Copied from [place] [id number]. Topic: General - Other >> Jun 13, 2017 10:18 AM Lennox Solders wrote: Reason for CRM: pt was seen yesterday and would like to come back in to have a1c recheck. Pt last a1c was 03-16-16

## 2017-06-16 ENCOUNTER — Other Ambulatory Visit: Payer: 59

## 2017-06-28 ENCOUNTER — Other Ambulatory Visit: Payer: Self-pay | Admitting: Family Medicine

## 2017-08-25 ENCOUNTER — Other Ambulatory Visit: Payer: Self-pay | Admitting: Family Medicine

## 2017-09-06 ENCOUNTER — Other Ambulatory Visit: Payer: Self-pay

## 2017-09-06 MED ORDER — SAXAGLIPTIN HCL 5 MG PO TABS: 5.0000 mg | ORAL_TABLET | Freq: Every day | ORAL | 0 refills | Status: DC

## 2017-09-11 ENCOUNTER — Ambulatory Visit: Payer: 59 | Admitting: Family Medicine

## 2017-09-20 ENCOUNTER — Telehealth: Payer: Self-pay

## 2017-09-20 ENCOUNTER — Telehealth: Payer: Self-pay | Admitting: Family Medicine

## 2017-09-20 NOTE — Telephone Encounter (Signed)
PA in progress.  Waiting for insurance decision.

## 2017-09-20 NOTE — Telephone Encounter (Signed)
PA is in progress. 

## 2017-09-20 NOTE — Telephone Encounter (Signed)
Copied from [place] [id number]. Topic: Quick Communication - Rx Refill/Question >> Sep 20, 2017  9:03 AM Alan Payne wrote: Medication: saxagliptin HCl (ONGLYZA) 5 MG TABS tablet  Pt states pharmacy has told pt that his insurance isn't covering this medication.  Pt was told that the physician would need to send a note to the insurance company to add this to his covered medications.  Preferred Pharmacy (with phone number or street name): [id number] - [place], [place] - [address] [phone] (Phone) [phone] (Fax)  Agent: Please be advised that RX refills may take up to 3 business days. We ask that you follow-up with your pharmacy.

## 2017-09-21 NOTE — Telephone Encounter (Signed)
PA approved.

## 2017-09-21 NOTE — Telephone Encounter (Signed)
PA for Onglyza has been approved.  Patient's pharmacy notified.

## 2017-10-07 ENCOUNTER — Other Ambulatory Visit: Payer: Self-pay | Admitting: Family Medicine

## 2017-11-03 ENCOUNTER — Other Ambulatory Visit: Payer: Self-pay | Admitting: Family Medicine

## 2017-11-13 ENCOUNTER — Encounter: Payer: Self-pay | Admitting: Family Medicine

## 2017-11-13 ENCOUNTER — Ambulatory Visit (INDEPENDENT_AMBULATORY_CARE_PROVIDER_SITE_OTHER): Payer: 59 | Admitting: Family Medicine

## 2017-11-13 VITALS — BP 136/84 | HR 70 | Temp 99.2°F | Ht 72.0 in | Wt 298.0 lb

## 2017-11-13 DIAGNOSIS — R110 Nausea: Secondary | ICD-10-CM

## 2017-11-13 DIAGNOSIS — R109 Unspecified abdominal pain: Secondary | ICD-10-CM | POA: Diagnosis not present

## 2017-11-13 DIAGNOSIS — R309 Painful micturition, unspecified: Secondary | ICD-10-CM | POA: Diagnosis not present

## 2017-11-13 LAB — POCT URINALYSIS DIPSTICK
Bilirubin, UA: NEGATIVE
Glucose, UA: POSITIVE — AB
Ketones, UA: NEGATIVE
Leukocytes, UA: NEGATIVE
Nitrite, UA: NEGATIVE
Protein, UA: POSITIVE — AB
Spec Grav, UA: 1.015 (ref 1.010–1.025)
Urobilinogen, UA: 0.2 E.U./dL
pH, UA: 6 (ref 5.0–8.0)

## 2017-11-13 MED ORDER — ONDANSETRON 8 MG PO TBDP: 8.0000 mg | ORAL_TABLET | Freq: Three times a day (TID) | ORAL | 0 refills | Status: DC | PRN

## 2017-11-13 MED ORDER — KETOROLAC TROMETHAMINE 60 MG/2ML IM SOLN
60.0000 mg | Freq: Once | INTRAMUSCULAR | Status: AC
  Administered 2017-11-13: 60 mg via INTRAMUSCULAR

## 2017-11-13 MED ORDER — DICLOFENAC SODIUM 75 MG PO TBEC: 75.0000 mg | DELAYED_RELEASE_TABLET | Freq: Two times a day (BID) | ORAL | 0 refills | Status: DC

## 2017-11-13 MED ORDER — TAMSULOSIN HCL 0.4 MG PO CAPS: 0.4000 mg | ORAL_CAPSULE | Freq: Every day | ORAL | 3 refills | Status: DC

## 2017-11-13 MED ORDER — ONDANSETRON HCL 4 MG/2ML IJ SOLN
4.0000 mg | Freq: Once | INTRAMUSCULAR | Status: AC
  Administered 2017-11-13: 4 mg via INTRAMUSCULAR

## 2017-11-13 MED ORDER — TRAMADOL HCL 50 MG PO TABS: 50.0000 mg | ORAL_TABLET | Freq: Three times a day (TID) | ORAL | 0 refills | Status: DC | PRN

## 2017-11-13 NOTE — Patient Instructions (Signed)
It was very nice to see you today!  You have a kidney stone.  Please start the dicofenac this evening. You should take this every 12 hours. Passes.  You can also use the Zofran as needed for nausea.  Please also start taking Flomax once daily to help with the passage of your stone.   I will also send in tramadol for you.  Let me know if your symptoms do not improve and we can get an CT scan.  Come back soon to discuss your diabetes.   Take care, Dr Jerline Pain

## 2017-11-13 NOTE — Progress Notes (Signed)
   Subjective:  Alan Payne is a 47 y.o. male who presents today with a chief complaint of flank Payne.   HPI:  Flank Payne, acute problem Started this morning.  Located in left flank.  Feels like prior episodes of kidney stones.  Started having some nausea and vomiting over the past several hours.  Has vomited about 5 pounds since this morning.  He has tried taking tramadol and Phenergan at home however vomited both these up.  Symptoms seem to be stable.  No fevers or chills.  No dysuria.  No notable hematuria.  Patient has a history of recurrent nephrolithiasis.  Patient states that prior stones involved in uric acid stones.  Largest stone passes approximately 12 mm.  ROS: Per HPI  PMH: He reports that he has never smoked. He has never used smokeless tobacco. He reports that he drinks about 1.0 standard drinks of alcohol per week. He reports that he does not use drugs.  Objective:  Physical Exam: BP 136/84 (BP Location: Left Arm, Patient Position: Sitting, Cuff Size: Large)   Pulse 70   Temp 99.2 F (37.3 C) (Oral)   Ht 6' (1.829 m)   Wt 298 lb (135.2 kg)   SpO2 98%   BMI 40.42 kg/m   Gen: NAD, resting comfortably CV: RRR with no murmurs appreciated Pulm: NWOB, CTAB with no crackles, wheezes, or rhonchi GI: Normal bowel sounds present. Soft, Nontender, Nondistended. MSK: No edema, cyanosis, or clubbing noted. No CVA tenderness.  Results for orders placed or performed in visit on 11/13/17 (from the past 24 hour(s))  POCT urinalysis dipstick     Status: Abnormal   Collection Time: 11/13/17 12:02 PM  Result Value Ref Range   Color, UA Yellow    Clarity, UA Clear    Glucose, UA Positive (A) Negative   Bilirubin, UA Negative    Ketones, UA Negative    Spec Grav, UA 1.015 1.010 - 1.025   Blood, UA Large    pH, UA 6.0 5.0 - 8.0   Protein, UA Positive (A) Negative   Urobilinogen, UA 0.2 0.2 or 1.0 E.U./dL   Nitrite, UA Negative    Leukocytes, UA Negative Negative   Appearance     Odor       Assessment/Plan:  Flank Payne History and UA consistent with recurrent nephrolithiasis.  Patient was given 4 mg of IM Zofran and 60 mg of IM Toradol in office today.  This significantly improved his symptoms.  We will treat patient with course of diclofenac 75 mg twice daily.  Also send in Zofran as needed for nausea.  Also start Flomax 0.4 mg daily.  Additionally sent in a prescription for tramadol as needed for severe Payne.  Patient has past 12 mm stones in the past-hopefully this will pass spontaneously with the above treatment.  Discussed reasons to return to care and seek emergent care.  If symptoms persist for the next several days or symptoms worsen, will need abdominal CT scan.   Alan Payne. Alan Pain, MD 11/13/2017 1:16 PM

## 2017-11-18 ENCOUNTER — Encounter: Payer: Self-pay | Admitting: Family Medicine

## 2017-11-23 ENCOUNTER — Encounter: Payer: Self-pay | Admitting: Family Medicine

## 2017-11-23 ENCOUNTER — Ambulatory Visit (INDEPENDENT_AMBULATORY_CARE_PROVIDER_SITE_OTHER): Payer: 59 | Admitting: Family Medicine

## 2017-11-23 VITALS — BP 142/88 | HR 86 | Temp 98.2°F | Ht 72.0 in | Wt 307.8 lb

## 2017-11-23 DIAGNOSIS — M79671 Pain in right foot: Secondary | ICD-10-CM

## 2017-11-23 DIAGNOSIS — N200 Calculus of kidney: Secondary | ICD-10-CM

## 2017-11-23 DIAGNOSIS — R432 Parageusia: Secondary | ICD-10-CM

## 2017-11-23 MED ORDER — COLCHICINE 0.6 MG PO CAPS: ORAL_CAPSULE | ORAL | 0 refills | Status: DC

## 2017-11-23 NOTE — Progress Notes (Signed)
   Subjective:  Alan Payne is a 47 y.o. male who presents today for same-day appointment with a chief complaint of foot Payne.   HPI:  Foot Payne, Acute problem Started yesterday.  Located in the middle of his right foot.  No obvious precipitating events.  No traumas or other obvious precipitating event. He is on diclofenac for a kidney stone which does not seem to help with the Payne. It is very painful and achey. He has a history of gout, but has not had a flare in several years.  Admits to eating quite a bit of red meat over the last couple weeks.  Alan Payne Started about a week ago.  Feels like there is ammonia in his mouth.  Recently started medications for his kidney stone, but denies any other obvious precipitating events.  Nephrolithiasis Patient last seen about a week and half ago for this.  Does like the stone is moving, however has not yet passed.  Infant looks fine to get his x-ray also okay perfect  ROS: Per HPI  PMH: He reports that he has never smoked. He has never used smokeless tobacco. He reports that he drinks about 1.0 standard drinks of alcohol per week. He reports that he does not use drugs.  Objective:  Physical Exam: BP (!) 142/88 (BP Location: Left Arm, Patient Position: Sitting, Cuff Size: Large)   Pulse 86   Temp 98.2 F (36.8 C) (Oral)   Ht 6' (1.829 m)   Wt (!) 307 lb 12.8 oz (139.6 kg)   SpO2 98%   BMI 41.75 kg/m   Gen: NAD, resting comfortably CV: RRR with no murmurs appreciated Pulm: NWOB, CTAB with no crackles, wheezes, or rhonchi MSK: - Right foot: No deformities.  Tender palpation along the midfoot.  Neurovascularly intact distally.  Assessment/Plan:  Payne of right midfoot His history is consistent with gout flare.  No history of trauma to suggest fracture.  History not consistent with stress fracture.  We will start empiric colchicine today.  We will also check uric acid level, though may be falsely elevated in setting of acute flare.   Discussed systemic steroids, however patient declined.  Nephrolithiasis No red flag signs or symptoms.  Seems to be passing slowly.  Continue diclofenac and Flomax.  If symptoms persist for another 1 to 2 weeks, will need abdominal CT.  Discussed reasons to seek emergent care and return to care earlier.  Alan Payne Unclear etiology.  Possibly medication side effect.  Check CBC, CMET, and uric acid level.  Reassured patient.  Alan Payne. Alan Pain, MD 11/23/2017 2:59 PM

## 2017-11-23 NOTE — Patient Instructions (Signed)
It was very nice to see you today!  Please start the colchicine. We will check blood work today. I think you have a mild gout flare in your foot.  Please let me know if you would like a cortisone shot or prednisone if the colchicine is not working.   Take care, Dr Jerline Pain

## 2017-11-23 NOTE — Assessment & Plan Note (Signed)
Unclear etiology.  Possibly medication side effect.  Check CBC, CMET, and uric acid level.  Reassured patient.

## 2017-11-23 NOTE — Assessment & Plan Note (Signed)
His history is consistent with gout flare.  No history of trauma to suggest fracture.  History not consistent with stress fracture.  We will start empiric colchicine today.  We will also check uric acid level, though may be falsely elevated in setting of acute flare.  Discussed systemic steroids, however patient declined.

## 2017-11-23 NOTE — Assessment & Plan Note (Signed)
No red flag signs or symptoms.  Seems to be passing slowly.  Continue diclofenac and Flomax.  If symptoms persist for another 1 to 2 weeks, will need abdominal CT.  Discussed reasons to seek emergent care and return to care earlier.

## 2017-11-24 ENCOUNTER — Encounter: Payer: Self-pay | Admitting: Family Medicine

## 2017-11-24 ENCOUNTER — Ambulatory Visit (INDEPENDENT_AMBULATORY_CARE_PROVIDER_SITE_OTHER): Payer: 59 | Admitting: Family Medicine

## 2017-11-24 ENCOUNTER — Emergency Department (HOSPITAL_COMMUNITY): Payer: 59

## 2017-11-24 ENCOUNTER — Encounter (HOSPITAL_COMMUNITY): Payer: Self-pay

## 2017-11-24 ENCOUNTER — Encounter (HOSPITAL_COMMUNITY): Payer: Self-pay | Admitting: Emergency Medicine

## 2017-11-24 ENCOUNTER — Other Ambulatory Visit: Payer: Self-pay

## 2017-11-24 ENCOUNTER — Telehealth: Payer: Self-pay

## 2017-11-24 ENCOUNTER — Inpatient Hospital Stay (HOSPITAL_COMMUNITY)
Admission: EM | Admit: 2017-11-24 | Discharge: 2017-11-27 | DRG: 661 | Disposition: A | Discharge status: Home / Self Care | Payer: 59 | Source: Ambulatory Visit | Attending: Internal Medicine | Admitting: Internal Medicine

## 2017-11-24 VITALS — BP 160/90 | HR 88 | Temp 98.8°F | Wt 269.0 lb

## 2017-11-24 DIAGNOSIS — R000 Tachycardia, unspecified: Secondary | ICD-10-CM | POA: Diagnosis present

## 2017-11-24 DIAGNOSIS — R110 Nausea: Secondary | ICD-10-CM

## 2017-11-24 DIAGNOSIS — G4733 Obstructive sleep apnea (adult) (pediatric): Secondary | ICD-10-CM | POA: Diagnosis present

## 2017-11-24 DIAGNOSIS — E669 Obesity, unspecified: Secondary | ICD-10-CM | POA: Diagnosis present

## 2017-11-24 DIAGNOSIS — I10 Essential (primary) hypertension: Secondary | ICD-10-CM | POA: Diagnosis present

## 2017-11-24 DIAGNOSIS — E875 Hyperkalemia: Secondary | ICD-10-CM | POA: Diagnosis present

## 2017-11-24 DIAGNOSIS — T39395A Adverse effect of other nonsteroidal anti-inflammatory drugs [NSAID], initial encounter: Secondary | ICD-10-CM | POA: Diagnosis present

## 2017-11-24 DIAGNOSIS — N200 Calculus of kidney: Secondary | ICD-10-CM

## 2017-11-24 DIAGNOSIS — E860 Dehydration: Secondary | ICD-10-CM | POA: Diagnosis present

## 2017-11-24 DIAGNOSIS — M79671 Pain in right foot: Secondary | ICD-10-CM | POA: Diagnosis present

## 2017-11-24 DIAGNOSIS — E119 Type 2 diabetes mellitus without complications: Secondary | ICD-10-CM

## 2017-11-24 DIAGNOSIS — Z8249 Family history of ischemic heart disease and other diseases of the circulatory system: Secondary | ICD-10-CM | POA: Diagnosis not present

## 2017-11-24 DIAGNOSIS — Z8051 Family history of malignant neoplasm of kidney: Secondary | ICD-10-CM | POA: Diagnosis not present

## 2017-11-24 DIAGNOSIS — Z6839 Body mass index (BMI) 39.0-39.9, adult: Secondary | ICD-10-CM | POA: Diagnosis not present

## 2017-11-24 DIAGNOSIS — I5030 Unspecified diastolic (congestive) heart failure: Secondary | ICD-10-CM | POA: Diagnosis not present

## 2017-11-24 DIAGNOSIS — N179 Acute kidney failure, unspecified: Secondary | ICD-10-CM | POA: Diagnosis present

## 2017-11-24 DIAGNOSIS — Z833 Family history of diabetes mellitus: Secondary | ICD-10-CM

## 2017-11-24 DIAGNOSIS — N132 Hydronephrosis with renal and ureteral calculous obstruction: Secondary | ICD-10-CM | POA: Diagnosis present

## 2017-11-24 DIAGNOSIS — I152 Hypertension secondary to endocrine disorders: Secondary | ICD-10-CM

## 2017-11-24 DIAGNOSIS — E1159 Type 2 diabetes mellitus with other circulatory complications: Secondary | ICD-10-CM

## 2017-11-24 DIAGNOSIS — Z7984 Long term (current) use of oral hypoglycemic drugs: Secondary | ICD-10-CM

## 2017-11-24 DIAGNOSIS — Z87442 Personal history of urinary calculi: Secondary | ICD-10-CM | POA: Diagnosis present

## 2017-11-24 LAB — COMPREHENSIVE METABOLIC PANEL
ALT: 11 U/L (ref 0–53)
ALT: 16 U/L (ref 0–44)
AST: 12 U/L (ref 0–37)
AST: 14 U/L — ABNORMAL LOW (ref 15–41)
Albumin: 4.1 g/dL (ref 3.5–5.2)
Albumin: 4.4 g/dL (ref 3.5–5.0)
Alkaline Phosphatase: 59 U/L (ref 39–117)
Alkaline Phosphatase: 68 U/L (ref 38–126)
Anion gap: 10 (ref 5–15)
BUN: 62 mg/dL — ABNORMAL HIGH (ref 6–23)
BUN: 62 mg/dL — ABNORMAL HIGH (ref 6–20)
CO2: 19 mEq/L (ref 19–32)
CO2: 20 mmol/L — ABNORMAL LOW (ref 22–32)
Calcium: 9.2 mg/dL (ref 8.4–10.5)
Calcium: 9.2 mg/dL (ref 8.9–10.3)
Chloride: 105 mEq/L (ref 96–112)
Chloride: 108 mmol/L (ref 98–111)
Creatinine, Ser: 8.67 mg/dL (ref 0.40–1.50)
Creatinine, Ser: 8.83 mg/dL — ABNORMAL HIGH (ref 0.61–1.24)
GFR calc Af Amer: 7 mL/min — ABNORMAL LOW (ref >60)
GFR calc non Af Amer: 6 mL/min — ABNORMAL LOW (ref >60)
GFR: 8.51 mL/min — CL (ref >60.00)
Glucose, Bld: 158 mg/dL — ABNORMAL HIGH (ref 70–99)
Glucose, Bld: 170 mg/dL — ABNORMAL HIGH (ref 70–99)
Potassium: 5.9 mEq/L — ABNORMAL HIGH (ref 3.5–5.1)
Potassium: 7 mmol/L (ref 3.5–5.1)
Sodium: 136 mEq/L (ref 135–145)
Sodium: 138 mmol/L (ref 135–145)
Total Bilirubin: 0.3 mg/dL (ref 0.2–1.2)
Total Bilirubin: 0.4 mg/dL (ref 0.3–1.2)
Total Protein: 7.5 g/dL (ref 6.0–8.3)
Total Protein: 8.6 g/dL — ABNORMAL HIGH (ref 6.5–8.1)

## 2017-11-24 LAB — URINALYSIS, ROUTINE W REFLEX MICROSCOPIC
Bacteria, UA: NONE SEEN
Bilirubin Urine: NEGATIVE
Glucose, UA: NEGATIVE mg/dL
Ketones, ur: NEGATIVE mg/dL
Leukocytes, UA: NEGATIVE
Nitrite: NEGATIVE
Protein, ur: 100 mg/dL — AB
Specific Gravity, Urine: 1.012 (ref 1.005–1.030)
pH: 6 (ref 5.0–8.0)

## 2017-11-24 LAB — CBC
HCT: 39.4 % (ref 39.0–52.0)
HCT: 43.4 % (ref 39.0–52.0)
Hemoglobin: 13.2 g/dL (ref 13.0–17.0)
Hemoglobin: 14.1 g/dL (ref 13.0–17.0)
MCH: 28.3 pg (ref 26.0–34.0)
MCHC: 33.5 g/dL (ref 30.0–36.0)
MCHC: 32.5 g/dL (ref 30.0–36.0)
MCV: 86 fl (ref 78.0–100.0)
MCV: 87.1 fL (ref 80.0–100.0)
Platelets: 280 10*3/uL (ref 150.0–400.0)
Platelets: 299 10*3/uL (ref 150–400)
RBC: 4.59 Mil/uL (ref 4.22–5.81)
RBC: 4.98 MIL/uL (ref 4.22–5.81)
RDW: 14.6 % (ref 11.5–15.5)
RDW: 14.2 % (ref 11.5–15.5)
WBC: 9 10*3/uL (ref 4.0–10.5)
WBC: 13.8 10*3/uL — ABNORMAL HIGH (ref 4.0–10.5)
nRBC: 0 % (ref 0.0–0.2)

## 2017-11-24 LAB — AMMONIA: Ammonia: <9 umol/L — ABNORMAL LOW (ref 9–35)

## 2017-11-24 LAB — POTASSIUM: Potassium: 5.5 mmol/L — ABNORMAL HIGH (ref 3.5–5.1)

## 2017-11-24 LAB — BLOOD GAS, VENOUS
Acid-base deficit: 5 mmol/L — ABNORMAL HIGH (ref 0.0–2.0)
Bicarbonate: 20.4 mmol/L (ref 20.0–28.0)
O2 Saturation: 44.6 %
Patient temperature: 98.6
pCO2, Ven: 41.2 mmHg — ABNORMAL LOW (ref 44.0–60.0)
pH, Ven: 7.316 (ref 7.250–7.430)

## 2017-11-24 LAB — I-STAT CG4 LACTIC ACID, ED: Lactic Acid, Venous: 1.34 mmol/L (ref 0.5–1.9)

## 2017-11-24 LAB — MAGNESIUM: Magnesium: 1.7 mg/dL (ref 1.7–2.4)

## 2017-11-24 LAB — LIPASE, BLOOD: Lipase: 31 U/L (ref 11–51)

## 2017-11-24 LAB — GLUCOSE, CAPILLARY: Glucose-Capillary: 101 mg/dL — ABNORMAL HIGH (ref 70–99)

## 2017-11-24 LAB — CBG MONITORING, ED: Glucose-Capillary: 122 mg/dL — ABNORMAL HIGH (ref 70–99)

## 2017-11-24 LAB — URIC ACID: Uric Acid, Serum: 8.9 mg/dL — ABNORMAL HIGH (ref 4.0–7.8)

## 2017-11-24 LAB — PHOSPHORUS: Phosphorus: 3.2 mg/dL (ref 2.5–4.6)

## 2017-11-24 MED ORDER — CALCIUM GLUCONATE-NACL 1-0.675 GM/50ML-% IV SOLN
1.0000 g | Freq: Once | INTRAVENOUS | Status: AC
  Administered 2017-11-24: 1000 mg via INTRAVENOUS
  Filled 2017-11-24: qty 50

## 2017-11-24 MED ORDER — DEXTROSE 50 % IV SOLN
1.0000 | Freq: Once | INTRAVENOUS | Status: AC
  Administered 2017-11-24: 50 mL via INTRAVENOUS
  Filled 2017-11-24: qty 50

## 2017-11-24 MED ORDER — HYDROMORPHONE HCL 1 MG/ML IJ SOLN
1.0000 mg | Freq: Once | INTRAMUSCULAR | Status: AC
  Administered 2017-11-24: 1 mg via INTRAVENOUS
  Filled 2017-11-24: qty 1

## 2017-11-24 MED ORDER — TRAMADOL HCL 50 MG PO TABS
50.0000 mg | ORAL_TABLET | Freq: Four times a day (QID) | ORAL | Status: DC | PRN
  Administered 2017-11-24 – 2017-11-26 (×5): 50 mg via ORAL
  Filled 2017-11-24 (×5): qty 1

## 2017-11-24 MED ORDER — ALBUTEROL SULFATE (2.5 MG/3ML) 0.083% IN NEBU
10.0000 mg | INHALATION_SOLUTION | Freq: Once | RESPIRATORY_TRACT | Status: AC
  Administered 2017-11-24: 10 mg via RESPIRATORY_TRACT
  Filled 2017-11-24: qty 12

## 2017-11-24 MED ORDER — TAMSULOSIN HCL 0.4 MG PO CAPS
0.4000 mg | ORAL_CAPSULE | Freq: Every day | ORAL | Status: DC
  Administered 2017-11-24 – 2017-11-27 (×4): 0.4 mg via ORAL
  Filled 2017-11-24 (×4): qty 1

## 2017-11-24 MED ORDER — SODIUM BICARBONATE 8.4 % IV SOLN
Freq: Once | INTRAVENOUS | Status: AC
  Administered 2017-11-24: 17:00:00 via INTRAVENOUS
  Filled 2017-11-24: qty 100

## 2017-11-24 MED ORDER — INSULIN ASPART 100 UNIT/ML ~~LOC~~ SOLN: 10.0000 [IU] | Freq: Once | SUBCUTANEOUS | Status: DC

## 2017-11-24 MED ORDER — ONDANSETRON HCL 4 MG/5ML PO SOLN: 4.0000 mg | Freq: Once | ORAL | Status: DC

## 2017-11-24 MED ORDER — SODIUM CHLORIDE 0.9 % IV SOLN
1000.0000 mL | INTRAVENOUS | Status: DC
  Administered 2017-11-24: 1000 mL via INTRAVENOUS

## 2017-11-24 MED ORDER — INSULIN ASPART 100 UNIT/ML ~~LOC~~ SOLN
0.0000 [IU] | Freq: Three times a day (TID) | SUBCUTANEOUS | Status: DC
  Administered 2017-11-26: 1 [IU] via SUBCUTANEOUS
  Administered 2017-11-26: 2 [IU] via SUBCUTANEOUS
  Administered 2017-11-27: 1 [IU] via SUBCUTANEOUS
  Administered 2017-11-27: 2 [IU] via SUBCUTANEOUS

## 2017-11-24 MED ORDER — SODIUM CHLORIDE 0.9 % IV SOLN
Freq: Once | INTRAVENOUS | Status: AC
  Administered 2017-11-24: 12:00:00 via INTRAVENOUS

## 2017-11-24 MED ORDER — SODIUM CHLORIDE 0.9 % IV BOLUS (SEPSIS)
1000.0000 mL | Freq: Once | INTRAVENOUS | Status: AC
  Administered 2017-11-24: 1000 mL via INTRAVENOUS

## 2017-11-24 MED ORDER — ONDANSETRON HCL 4 MG/2ML IJ SOLN
4.0000 mg | Freq: Once | INTRAMUSCULAR | Status: DC
  Administered 2017-11-24: 4 mg via INTRAMUSCULAR

## 2017-11-24 MED ORDER — SODIUM POLYSTYRENE SULFONATE 15 GM/60ML PO SUSP
30.0000 g | Freq: Once | ORAL | Status: AC
  Administered 2017-11-24: 30 g via ORAL
  Filled 2017-11-24: qty 120

## 2017-11-24 MED ORDER — LABETALOL HCL 5 MG/ML IV SOLN
10.0000 mg | Freq: Once | INTRAVENOUS | Status: AC
  Administered 2017-11-24: 10 mg via INTRAVENOUS
  Filled 2017-11-24: qty 4

## 2017-11-24 MED ORDER — ONDANSETRON 4 MG PO TBDP: 4.0000 mg | ORAL_TABLET | Freq: Once | ORAL | Status: DC

## 2017-11-24 MED ORDER — HYDRALAZINE HCL 20 MG/ML IJ SOLN
10.0000 mg | Freq: Four times a day (QID) | INTRAMUSCULAR | Status: DC | PRN
  Administered 2017-11-25 (×2): 10 mg via INTRAVENOUS
  Filled 2017-11-24 (×2): qty 1

## 2017-11-24 MED ORDER — PROMETHAZINE HCL 25 MG/ML IJ SOLN
25.0000 mg | Freq: Once | INTRAMUSCULAR | Status: AC
  Administered 2017-11-24: 25 mg via INTRAVENOUS
  Filled 2017-11-24: qty 1

## 2017-11-24 MED ORDER — INSULIN ASPART 100 UNIT/ML IV SOLN
5.0000 [IU] | Freq: Once | INTRAVENOUS | Status: AC
  Administered 2017-11-24: 5 [IU] via INTRAVENOUS
  Filled 2017-11-24: qty 0.05

## 2017-11-24 NOTE — Patient Instructions (Signed)
It was very nice to see you today!  I am sorry that you are feeling sick.  Please go to the ED at [facility]. You will need pain medications, a CT scan, blood work, and IV fluids.   Take care, Dr Jerline Pain

## 2017-11-24 NOTE — Consult Note (Signed)
Renal Service Consult Note [facility]  Alan Payne 11/24/2017 [place] Requesting Physician:  Dr Cruzita Lederer  Reason for Consult:  AKI HPI: The patient is a 47 y.o. year-old with hx of DM2 and kindey stones presented to ED w/ 2 wk history of L flank pains, nausea and vomiting.  Unable to keep anything down. No diarrhea, no fevers or chills. No hx renal failure.  +heavy nsaid use recently w/ ibuprofen and voltaren.  Came to ED where creat 8 and K 7.0.  Creat was 1.0 in May 2019.  CT abd showed mild atrophy of R kidney and UPJ stone on L side w/ mild - mod hydronephrosis.  Pt rec'd 2 L bolus here and has voided 2 times good amounts.  Given IV protocol meds for ^K+ including 30 gm kayexalate.    Pt says he has passed in the last 2 years about 30 stones, many of those he surmises were stone fragments broken up during lithotripsy procedure.  No hx bladder or prostate issues, no voiding difficulty.   Urology has seen and are planning L ureteral stent per patient.     ROS  denies CP  no joint pain   no HA  no blurry vision  no rash  no diarrhea  no nausea/ vomiting  no dysuria  no difficulty voiding   Past Medical History  Past Medical History:  Diagnosis Date  . Bilateral ureteral calculi   . Hydronephrosis, right   . OSA on CPAP   . Type 2 diabetes mellitus [facility])    Past Surgical History  Past Surgical History:  Procedure Laterality Date  . CIRCUMCISION  1993  . EXTRACORPOREAL SHOCK WAVE LITHOTRIPSY Left 07-21-2014   Family History  Family History  Problem Relation Age of Onset  . Kidney cancer Father   . Hypertension Father   . Cancer Father        kidney  . Diabetes Mother   . Prostate cancer Neg Hx    Social History  reports that he has never smoked. He has never used smokeless tobacco. He reports that he drinks about 1.0 standard drinks of alcohol per week. He reports that he does not use drugs. Allergies  Allergies  Allergen Reactions  .  Sitagliptin-Metformin Hcl Itching    Janumet   Home medications Prior to Admission medications   Medication Sig Start Date End Date Taking? Authorizing Provider  aspirin 81 MG tablet Take 81 mg by mouth daily.     Yes [provider]  Colchicine 0.6 MG CAPS Day 1: Take 2 caps, then 1 cap an hour later. Day 2 and beyond: 1 cap daily Patient taking differently: Take 0.6 mg by mouth daily.  11/23/17  Yes Vivi Barrack, MD  diclofenac (VOLTAREN) 75 MG EC tablet Take 1 tablet (75 mg total) by mouth 2 (two) times daily. 11/13/17  Yes Vivi Barrack, MD  Ginkgo Biloba 40 MG TABS Take 1 tablet by mouth daily.    Yes [provider]  metFORMIN (GLUCOPHAGE-XR) 750 MG 24 hr tablet TAKE 2 TABLETS BY MOUTH TWICE A DAY Patient taking differently: Take 750 mg by mouth 2 (two) times daily.  11/03/17  Yes Vivi Barrack, MD  Multiple Vitamin (MULTIVITAMIN) tablet Take 1 tablet by mouth daily.   Yes [provider]  ondansetron (ZOFRAN ODT) 8 MG disintegrating tablet Take 1 tablet (8 mg total) by mouth every 8 (eight) hours as needed for nausea or vomiting. 11/13/17  Yes Vivi Barrack, MD  saxagliptin HCl (ONGLYZA) 5 MG TABS tablet Take 1 tablet (5 mg total) by mouth daily. 09/06/17  Yes Vivi Barrack, MD  [facility] PO Take 1 tablet by mouth daily.   Yes [provider]  tamsulosin (FLOMAX) 0.4 MG CAPS capsule Take 1 capsule (0.4 mg total) by mouth daily. 11/13/17  Yes Vivi Barrack, MD  traMADol (ULTRAM) 50 MG tablet Take 1-2 tablets (50-100 mg total) by mouth every 8 (eight) hours as needed. 11/13/17  Yes Vivi Barrack, MD   Liver Function Tests Recent Labs  Lab 11/23/17 1532 11/24/17 1204  AST 12 14*  ALT 11 16  ALKPHOS 59 68  BILITOT 0.3 0.4  PROT 7.5 8.6*  ALBUMIN 4.1 4.4   Recent Labs  Lab 11/24/17 1204  LIPASE 31   CBC Recent Labs  Lab 11/23/17 1532 11/24/17 1204  WBC 9.0 13.8*  HGB 13.2 14.1  HCT 39.4 43.4  MCV 86.0 87.1  PLT 280.0 751    Basic Metabolic Panel Recent Labs  Lab 11/23/17 1532 11/24/17 1204 11/24/17 1500  NA 136 138  --   K 5.9* 7.0*  --   CL 105 108  --   CO2 19 20*  --   GLUCOSE 158* 170*  --   BUN 62* 62*  --   CREATININE 8.67* 8.83*  --   CALCIUM 9.2 9.2  --   PHOS  --   --  3.2   Iron/TIBC/Ferritin/ %Sat No results found for: IRON, TIBC, FERRITIN, IRONPCTSAT  Vitals:   11/24/17 1330 11/24/17 1336 11/24/17 1338 11/24/17 1545  BP:  128/81  (!) 154/80  Pulse: 75 89 82 83  Resp: (!) 21 19 20  (!) 23  Temp:      TempSrc:      SpO2: 100% 100% 100% 100%   Exam Gen alert, no distress No rash, cyanosis or gangrene Sclera anicteric, throat clear  No jvd or bruits  Chest clear bilat RRR no MRG Abd soft ntnd no mass or ascites +bs GU normal male MS no joint effusions or deformity Ext no LEor UE  edema, no wounds or ulcers Neuro is alert, Ox 3 , nf, calm and interacting  Hb 13, alb 4.4    Impression/ Plan: 1. AKI - combination of vol depletion/ obstructed L kidney/ atrophic L kidney and NSAID's.   Agree w/ your management including generous IVF"s, holding /avoiding nsaids and other renal toxins, and relief of obstruction L side per urology. Will follow, should recover fully.  2. Hx kidney stones, 100% uric acid per family 3. DM2 - not on insulin 4. Vol depletion   Kelly Splinter MD [employer] pager [phone]   11/24/2017, 6:13 PM

## 2017-11-24 NOTE — H&P (View-Only) (Signed)
Urology Consult  Referring physician: Dr. Gherghe Reason for referral: left ureteral calculus, acute renal failure  Chief Complaint: left flank pain  History of Present Illness: Alan Payne is a 47yo with a history of nephrolithiasis who presented to the ER today with a 2 week history of severe left flank pain. The pain is sharp, intermittent, severe, and nonradiating. He has had numerous stone events and has passed over 30 stone sin the past 18 months. He was previously seen by Dr. Bauer at [facility] for his stones and previously underwent ESWL. The pain had been intermittent for 2 weeks and he had been taking motrin 600-800 2-3x per day for 2 weeks. No other associated symptoms. Pain is alleviated with narcotics. No exacerbating events. Creatinine on presentation was 8.8. CT shows a 13mm left proximal ureteral calculus with moderate hydronephrosis. No fevers/chills/sweats. No nausea or vomiting  Past Medical History:  Diagnosis Date  . Bilateral ureteral calculi   . Hydronephrosis, right   . OSA on CPAP   . Type 2 diabetes mellitus (HCC)    Past Surgical History:  Procedure Laterality Date  . CIRCUMCISION  1993  . EXTRACORPOREAL SHOCK WAVE LITHOTRIPSY Left 07-21-2014    Medications: I have reviewed the patient's current medications. Allergies:  Allergies  Allergen Reactions  . Sitagliptin-Metformin Hcl Itching    Janumet    Family History  Problem Relation Age of Onset  . Kidney cancer Father   . Hypertension Father   . Cancer Father        kidney  . Diabetes Mother   . Prostate cancer Neg Hx    Social History:  reports that he has never smoked. He has never used smokeless tobacco. He reports that he drinks about 1.0 standard drinks of alcohol per week. He reports that he does not use drugs.  Review of Systems  Genitourinary: Positive for flank pain.  All other systems reviewed and are negative.   Physical Exam:  Vital signs in last 24 hours: Temp:  [98.7 F  (37.1 C)-98.8 F (37.1 C)] 98.7 F (37.1 C) (10/18 1135) Pulse Rate:  [72-94] 83 (10/18 1930) Resp:  [19-23] 21 (10/18 1930) BP: (128-190)/(71-102) 147/78 (10/18 1818) SpO2:  [98 %-100 %] 100 % (10/18 1930) Weight:  [122 kg] 122 kg (10/18 1036) Physical Exam  Constitutional: He is oriented to person, place, and time. He appears well-developed and well-nourished.  HENT:  Head: Normocephalic and atraumatic.  Eyes: Pupils are equal, round, and reactive to light. EOM are normal.  Neck: Normal range of motion. No thyromegaly present.  Cardiovascular: Normal rate and regular rhythm.  Respiratory: Effort normal. No respiratory distress.  GI: Soft. He exhibits no distension. Hernia confirmed negative in the right inguinal area and confirmed negative in the left inguinal area.  Genitourinary: Testes normal and penis normal.  Musculoskeletal: Normal range of motion. He exhibits no edema.  Lymphadenopathy:       Right: No inguinal adenopathy present.       Left: No inguinal adenopathy present.  Neurological: He is alert and oriented to person, place, and time.  Skin: Skin is warm and dry.  Psychiatric: He has a normal mood and affect. His behavior is normal. Judgment and thought content normal.    Laboratory Data:  Results for orders placed or performed during the hospital encounter of 11/24/17 (from the past 72 hour(s))  Urinalysis, Routine w reflex microscopic     Status: Abnormal   Collection Time: 11/24/17 11:37 AM  Result Value   Ref Range   Color, Urine YELLOW YELLOW   APPearance CLEAR CLEAR   Specific Gravity, Urine 1.012 1.005 - 1.030   pH 6.0 5.0 - 8.0   Glucose, UA NEGATIVE NEGATIVE mg/dL   Hgb urine dipstick MODERATE (A) NEGATIVE   Bilirubin Urine NEGATIVE NEGATIVE   Ketones, ur NEGATIVE NEGATIVE mg/dL   Protein, ur 100 (A) NEGATIVE mg/dL   Nitrite NEGATIVE NEGATIVE   Leukocytes, UA NEGATIVE NEGATIVE   RBC / HPF 21-50 0 - 5 RBC/hpf   WBC, UA 0-5 0 - 5 WBC/hpf   Bacteria,  UA NONE SEEN NONE SEEN   Squamous Epithelial / LPF 0-5 0 - 5    Comment: Performed at [facility], [address] [place], [place] [address]  Lipase, blood     Status: None   Collection Time: 11/24/17 12:04 PM  Result Value Ref Range   Lipase 31 11 - 51 U/L    Comment: Performed at [facility], [address] [place], [place] [address]  Comprehensive metabolic panel     Status: Abnormal   Collection Time: 11/24/17 12:04 PM  Result Value Ref Range   Sodium 138 135 - 145 mmol/L   Potassium 7.0 (HH) 3.5 - 5.1 mmol/L    Comment: NO VISIBLE HEMOLYSIS CRITICAL RESULT CALLED TO, READ BACK BY AND VERIFIED WITH: HALL,C. RN AT 1304 11/24/17 MULLINS,T    Chloride 108 98 - 111 mmol/L   CO2 20 (L) 22 - 32 mmol/L   Glucose, Bld 170 (H) 70 - 99 mg/dL   BUN 62 (H) 6 - 20 mg/dL   Creatinine, Ser 8.83 (H) 0.61 - 1.24 mg/dL   Calcium 9.2 8.9 - 10.3 mg/dL   Total Protein 8.6 (H) 6.5 - 8.1 g/dL   Albumin 4.4 3.5 - 5.0 g/dL   AST 14 (L) 15 - 41 U/L   ALT 16 0 - 44 U/L   Alkaline Phosphatase 68 38 - 126 U/L   Total Bilirubin 0.4 0.3 - 1.2 mg/dL   GFR calc non Af Amer 6 (L) >60 mL/min   GFR calc Af Amer 7 (L) >60 mL/min    Comment: (NOTE) The eGFR has been calculated using the CKD EPI equation. This calculation has not been validated in all clinical situations. eGFR's persistently <60 mL/min signify possible Chronic Kidney Disease.    Anion gap 10 5 - 15    Comment: Performed at [facility], [address] [place], [place] [address]  CBC     Status: Abnormal   Collection Time: 11/24/17 12:04 PM  Result Value Ref Range   WBC 13.8 (H) 4.0 - 10.5 K/uL   RBC 4.98 4.22 - 5.81 MIL/uL   Hemoglobin 14.1 13.0 - 17.0 g/dL   HCT 43.4 39.0 - 52.0 %   MCV 87.1 80.0 - 100.0 fL   MCH 28.3 26.0 - 34.0 pg   MCHC 32.5 30.0 - 36.0 g/dL   RDW 14.2 11.5 - 15.5 %   Platelets 299 150 - 400 K/uL   nRBC 0.0 0.0 - 0.2 %    Comment: Performed at [facility], [address] [place], [place] [address]  Blood gas, venous     Status: Abnormal   Collection Time: 11/24/17  1:32 PM  Result Value Ref Range   pH, Ven 7.316 7.250 - 7.430   pCO2, Ven 41.2 (L) 44.0 - 60.0 mmHg   pO2, Ven BELOW REPORTABLE RANGE 32.0 - 45.0 mmHg      Comment: CRITICAL RESULT CALLED TO, READ BACK BY AND VERIFIED WITH: M.PFEIFFER MD AT 1335 BY M.JESTER, RRT, RCP ON 11/24/2017    Bicarbonate 20.4 20.0 - 28.0 mmol/L   Acid-base deficit 5.0 (H) 0.0 - 2.0 mmol/L   O2 Saturation 44.6 %   Patient temperature 98.6    Collection site VENOUS    Drawn by DRAWN BY RN    Sample type VENOUS     Comment: Performed at [facility], [address] [place], [place] [address]  I-Stat CG4 Lactic Acid, ED     Status: None   Collection Time: 11/24/17  1:51 PM  Result Value Ref Range   Lactic Acid, Venous 1.34 0.5 - 1.9 mmol/L  Magnesium     Status: None   Collection Time: 11/24/17  3:00 PM  Result Value Ref Range   Magnesium 1.7 1.7 - 2.4 mg/dL    Comment: Performed at [facility], [address] [place], [place] [address]  Phosphorus     Status: None   Collection Time: 11/24/17  3:00 PM  Result Value Ref Range   Phosphorus 3.2 2.5 - 4.6 mg/dL    Comment: Performed at [facility], [address] [place], [place] [address]  Ammonia     Status: Abnormal   Collection Time: 11/24/17  3:00 PM  Result Value Ref Range   Ammonia <9 (L) 9 - 35 umol/L    Comment: [facility] at  [facility], [address] [place], [place] [address]  CBG monitoring, ED     Status: Abnormal   Collection Time: 11/24/17  4:52 PM  Result Value Ref Range   Glucose-Capillary 122 (H) 70 - 99 mg/dL   No results found for this or any previous visit (from the past 240 hour(s)). Creatinine: Recent Labs    11/23/17 1532 11/24/17 1204  CREATININE 8.67* 8.83*   Baseline Creatinine:  unknown  Impression/Assessment:  47yo with a left ureteral calculus and acute renal failure  Plan:  1. The patient's ARF is likely a combination of NSAID abuse, dehydration and his left ureteral calculus. We discussed the management of his ureteral calculus including stent placement and nephrostomy tube placement and after discussing the options the patient wishes to proceed with ureteral stent placement. The risks/benefits/alternatives to left ureteral stent placement was explained to the patient and he understands and wishes to proceed with surgery. He is schedule for surgery tomorrow late morning/early afternoon. Please make the patient NPO after midnight  Alan Payne 11/24/2017, 7:49 PM     

## 2017-11-24 NOTE — Telephone Encounter (Addendum)
[facility] lab called with critical creatinine 8.67 and GFR 8.51.

## 2017-11-24 NOTE — Consult Note (Signed)
Urology Consult  Referring physician: Dr. Gherghe Reason for referral: left ureteral calculus, acute renal failure  Chief Complaint: left flank pain  History of Present Illness: Alan Payne is a 47yo with a history of nephrolithiasis who presented to the ER today with a 2 week history of severe left flank pain. The pain is sharp, intermittent, severe, and nonradiating. He has had numerous stone events and has passed over 30 stone sin the past 18 months. He was previously seen by Dr. Bauer at [facility] for his stones and previously underwent ESWL. The pain had been intermittent for 2 weeks and he had been taking motrin 600-800 2-3x per day for 2 weeks. No other associated symptoms. Pain is alleviated with narcotics. No exacerbating events. Creatinine on presentation was 8.8. CT shows a 13mm left proximal ureteral calculus with moderate hydronephrosis. No fevers/chills/sweats. No nausea or vomiting  Past Medical History:  Diagnosis Date  . Bilateral ureteral calculi   . Hydronephrosis, right   . OSA on CPAP   . Type 2 diabetes mellitus (HCC)    Past Surgical History:  Procedure Laterality Date  . CIRCUMCISION  1993  . EXTRACORPOREAL SHOCK WAVE LITHOTRIPSY Left 07-21-2014    Medications: I have reviewed the patient's current medications. Allergies:  Allergies  Allergen Reactions  . Sitagliptin-Metformin Hcl Itching    Janumet    Family History  Problem Relation Age of Onset  . Kidney cancer Father   . Hypertension Father   . Cancer Father        kidney  . Diabetes Mother   . Prostate cancer Neg Hx    Social History:  reports that he has never smoked. He has never used smokeless tobacco. He reports that he drinks about 1.0 standard drinks of alcohol per week. He reports that he does not use drugs.  Review of Systems  Genitourinary: Positive for flank pain.  All other systems reviewed and are negative.   Physical Exam:  Vital signs in last 24 hours: Temp:  [98.7 F  (37.1 C)-98.8 F (37.1 C)] 98.7 F (37.1 C) (10/18 1135) Pulse Rate:  [72-94] 83 (10/18 1930) Resp:  [19-23] 21 (10/18 1930) BP: (128-190)/(71-102) 147/78 (10/18 1818) SpO2:  [98 %-100 %] 100 % (10/18 1930) Weight:  [122 kg] 122 kg (10/18 1036) Physical Exam  Constitutional: He is oriented to person, place, and time. He appears well-developed and well-nourished.  HENT:  Head: Normocephalic and atraumatic.  Eyes: Pupils are equal, round, and reactive to light. EOM are normal.  Neck: Normal range of motion. No thyromegaly present.  Cardiovascular: Normal rate and regular rhythm.  Respiratory: Effort normal. No respiratory distress.  GI: Soft. He exhibits no distension. Hernia confirmed negative in the right inguinal area and confirmed negative in the left inguinal area.  Genitourinary: Testes normal and penis normal.  Musculoskeletal: Normal range of motion. He exhibits no edema.  Lymphadenopathy:       Right: No inguinal adenopathy present.       Left: No inguinal adenopathy present.  Neurological: He is alert and oriented to person, place, and time.  Skin: Skin is warm and dry.  Psychiatric: He has a normal mood and affect. His behavior is normal. Judgment and thought content normal.    Laboratory Data:  Results for orders placed or performed during the hospital encounter of 11/24/17 (from the past 72 hour(s))  Urinalysis, Routine w reflex microscopic     Status: Abnormal   Collection Time: 11/24/17 11:37 AM  Result Value   Ref Range   Color, Urine YELLOW YELLOW   APPearance CLEAR CLEAR   Specific Gravity, Urine 1.012 1.005 - 1.030   pH 6.0 5.0 - 8.0   Glucose, UA NEGATIVE NEGATIVE mg/dL   Hgb urine dipstick MODERATE (A) NEGATIVE   Bilirubin Urine NEGATIVE NEGATIVE   Ketones, ur NEGATIVE NEGATIVE mg/dL   Protein, ur 100 (A) NEGATIVE mg/dL   Nitrite NEGATIVE NEGATIVE   Leukocytes, UA NEGATIVE NEGATIVE   RBC / HPF 21-50 0 - 5 RBC/hpf   WBC, UA 0-5 0 - 5 WBC/hpf   Bacteria,  UA NONE SEEN NONE SEEN   Squamous Epithelial / LPF 0-5 0 - 5    Comment: Performed at [facility], [address] [place], [place] [address]  Lipase, blood     Status: None   Collection Time: 11/24/17 12:04 PM  Result Value Ref Range   Lipase 31 11 - 51 U/L    Comment: Performed at [facility], [address] [place], [place] [address]  Comprehensive metabolic panel     Status: Abnormal   Collection Time: 11/24/17 12:04 PM  Result Value Ref Range   Sodium 138 135 - 145 mmol/L   Potassium 7.0 (HH) 3.5 - 5.1 mmol/L    Comment: NO VISIBLE HEMOLYSIS CRITICAL RESULT CALLED TO, READ BACK BY AND VERIFIED WITH: HALL,C. RN AT 1304 11/24/17 MULLINS,T    Chloride 108 98 - 111 mmol/L   CO2 20 (L) 22 - 32 mmol/L   Glucose, Bld 170 (H) 70 - 99 mg/dL   BUN 62 (H) 6 - 20 mg/dL   Creatinine, Ser 8.83 (H) 0.61 - 1.24 mg/dL   Calcium 9.2 8.9 - 10.3 mg/dL   Total Protein 8.6 (H) 6.5 - 8.1 g/dL   Albumin 4.4 3.5 - 5.0 g/dL   AST 14 (L) 15 - 41 U/L   ALT 16 0 - 44 U/L   Alkaline Phosphatase 68 38 - 126 U/L   Total Bilirubin 0.4 0.3 - 1.2 mg/dL   GFR calc non Af Amer 6 (L) >60 mL/min   GFR calc Af Amer 7 (L) >60 mL/min    Comment: (NOTE) The eGFR has been calculated using the CKD EPI equation. This calculation has not been validated in all clinical situations. eGFR's persistently <60 mL/min signify possible Chronic Kidney Disease.    Anion gap 10 5 - 15    Comment: Performed at [facility], [address] [place], [place] [address]  CBC     Status: Abnormal   Collection Time: 11/24/17 12:04 PM  Result Value Ref Range   WBC 13.8 (H) 4.0 - 10.5 K/uL   RBC 4.98 4.22 - 5.81 MIL/uL   Hemoglobin 14.1 13.0 - 17.0 g/dL   HCT 43.4 39.0 - 52.0 %   MCV 87.1 80.0 - 100.0 fL   MCH 28.3 26.0 - 34.0 pg   MCHC 32.5 30.0 - 36.0 g/dL   RDW 14.2 11.5 - 15.5 %   Platelets 299 150 - 400 K/uL   nRBC 0.0 0.0 - 0.2 %    Comment: Performed at [facility], [address] [place], [place] [address]  Blood gas, venous     Status: Abnormal   Collection Time: 11/24/17  1:32 PM  Result Value Ref Range   pH, Ven 7.316 7.250 - 7.430   pCO2, Ven 41.2 (L) 44.0 - 60.0 mmHg   pO2, Ven BELOW REPORTABLE RANGE 32.0 - 45.0 mmHg      Comment: CRITICAL RESULT CALLED TO, READ BACK BY AND VERIFIED WITH: M.PFEIFFER MD AT 1335 BY M.JESTER, RRT, RCP ON 11/24/2017    Bicarbonate 20.4 20.0 - 28.0 mmol/L   Acid-base deficit 5.0 (H) 0.0 - 2.0 mmol/L   O2 Saturation 44.6 %   Patient temperature 98.6    Collection site VENOUS    Drawn by DRAWN BY RN    Sample type VENOUS     Comment: Performed at [facility], [address] [place], [place] [address]  I-Stat CG4 Lactic Acid, ED     Status: None   Collection Time: 11/24/17  1:51 PM  Result Value Ref Range   Lactic Acid, Venous 1.34 0.5 - 1.9 mmol/L  Magnesium     Status: None   Collection Time: 11/24/17  3:00 PM  Result Value Ref Range   Magnesium 1.7 1.7 - 2.4 mg/dL    Comment: Performed at [facility], [address] [place], [place] [address]  Phosphorus     Status: None   Collection Time: 11/24/17  3:00 PM  Result Value Ref Range   Phosphorus 3.2 2.5 - 4.6 mg/dL    Comment: Performed at [facility], [address] [place], [place] [address]  Ammonia     Status: Abnormal   Collection Time: 11/24/17  3:00 PM  Result Value Ref Range   Ammonia <9 (L) 9 - 35 umol/L    Comment: Performed at [facility], [address] [place], [place] [address]  CBG monitoring, ED     Status: Abnormal   Collection Time: 11/24/17  4:52 PM  Result Value Ref Range   Glucose-Capillary 122 (H) 70 - 99 mg/dL   No results found for this or any previous visit (from the past 240 hour(s)). Creatinine: Recent Labs    11/23/17 1532 11/24/17 1204  CREATININE 8.67* 8.83*   Baseline Creatinine:  unknown  Impression/Assessment:  47yo with a left ureteral calculus and acute renal failure  Plan:  1. The patient's ARF is likely a combination of NSAID abuse, dehydration and his left ureteral calculus. We discussed the management of his ureteral calculus including stent placement and nephrostomy tube placement and after discussing the options the patient wishes to proceed with ureteral stent placement. The risks/benefits/alternatives to left ureteral stent placement was explained to the patient and he understands and wishes to proceed with surgery. He is schedule for surgery tomorrow late morning/early afternoon. Please make the patient NPO after midnight  Alan Payne 11/24/2017, 7:49 PM     

## 2017-11-24 NOTE — Addendum Note (Signed)
Addended by: Gwenyth Ober R on: 11/24/2017 02:45 PM   Modules accepted: Orders

## 2017-11-24 NOTE — Progress Notes (Signed)
   Subjective:  Alan Payne is a 47 y.o. male who presents today with a chief complaint of vomiting.   HPI:  Vomiting, acute problem Patient was seen yesterday for midfoot pain and dysgeusia.  Yesterday evening, started having severe vomiting.  Blood work was drawn, however has not yet resulted.  He has tried taking Zofran which has not helped.  Has quite a bit of pain due to a kidney stone but is not yet passed.  He has vomiting every 1-2 hours.  Symptoms seem to be worsening.  He has not been able to keep anything down.  ROS: Per HPI  PMH: He reports that he has never smoked. He has never used smokeless tobacco. He reports that he drinks about 1.0 standard drinks of alcohol per week. He reports that he does not use drugs.  Objective:  Physical Exam: BP (!) 160/90 (BP Location: Right Arm, Patient Position: Sitting, Cuff Size: Large)   Pulse 88   Temp 98.8 F (37.1 C) (Oral)   Wt 269 lb (122 kg)   SpO2 98%   BMI 36.48 kg/m   Wt Readings from Last 3 Encounters:  11/24/17 269 lb (122 kg)  11/23/17 (!) 307 lb 12.8 oz (139.6 kg)  11/13/17 298 lb (135.2 kg)  Gen: Ill-appearing 47 year old man in no acute distress CV: RRR with no murmurs appreciated Pulm: NWOB, CTAB with no crackles, wheezes, or rhonchi GI: Normal bowel sounds present. Soft, Nontender, Nondistended.  Assessment/Plan:  Vomiting CBC and CMET have not yet returned.  He has not been able to tolerate p.o.  Patient was given IM Zofran 4mg  in office today with no improvement in his symptoms.  Advised patient and his wife to go to the emergency department for CBC, CMET, IV fluids, IV pain medications, and abdominal CT.  Patient and family declined EMS transport.  Vomiting likely secondary to complication from his kidney stone.  Algis Greenhouse. Jerline Pain, MD 11/24/2017 10:46 AM

## 2017-11-24 NOTE — Progress Notes (Signed)
Pt hypertensive since arrival to department. Attempted to treat pain however BP remains elevated. NP on call notified and orders received. Will report to oncoming shift.

## 2017-11-24 NOTE — Telephone Encounter (Signed)
Results noted. Pt currently in ED.  Algis Greenhouse. Jerline Pain, MD 11/24/2017 1:26 PM

## 2017-11-24 NOTE — H&P (View-Only) (Signed)
Urology Consult  Referring physician: Dr. Cruzita Lederer Reason for referral: left ureteral calculus, acute renal failure  Chief Complaint: left flank pain  History of Present Illness: Alan Payne is a 48yo with a history of nephrolithiasis who presented to the ER today with a 2 week history of severe left flank pain. The pain is sharp, intermittent, severe, and nonradiating. He has had numerous stone events and has passed over 30 stone sin the past 18 months. He was previously seen by Dr. Exie Parody at [facility] for his stones and previously underwent ESWL. The pain had been intermittent for 2 weeks and he had been taking motrin 600-800 2-3x per day for 2 weeks. No other associated symptoms. Pain is alleviated with narcotics. No exacerbating events. Creatinine on presentation was 8.8. CT shows a 32m left proximal ureteral calculus with moderate hydronephrosis. No fevers/chills/sweats. No nausea or vomiting  Past Medical History:  Diagnosis Date  . Bilateral ureteral calculi   . Hydronephrosis, right   . OSA on CPAP   . [place]    Past Surgical History:  Procedure Laterality Date  . CIRCUMCISION  1993  . EXTRACORPOREAL SHOCK WAVE LITHOTRIPSY Left 07-21-2014    Medications: I have reviewed the patient's current medications. Allergies:  Allergies  Allergen Reactions  . Sitagliptin-Metformin Hcl Itching    Janumet    Family History  Problem Relation Age of Onset  . Kidney cancer Father   . Hypertension Father   . Cancer Father        kidney  . Diabetes Mother   . Prostate cancer Neg Hx    Social History:  reports that he has never smoked. He has never used smokeless tobacco. He reports that he drinks about 1.0 standard drinks of alcohol per week. He reports that he does not use drugs.  Review of Systems  Genitourinary: Positive for flank pain.  All other systems reviewed and are negative.   Physical Exam:  Vital signs in last 24 hours: Temp:  [98.7 F  (37.1 C)-98.8 F (37.1 C)] 98.7 F (37.1 C) (10/18 1135) Pulse Rate:  [72-94] 83 (10/18 1930) Resp:  [19-23] 21 (10/18 1930) BP: (128-190)/(71-102) 147/78 (10/18 1818) SpO2:  [98 %-100 %] 100 % (10/18 1930) Weight:  [[161kg] 122 kg (10/18 1036) Physical Exam  Constitutional: He is oriented to person, place, and time. He appears well-developed and well-nourished.  HENT:  Head: Normocephalic and atraumatic.  Eyes: Pupils are equal, round, and reactive to light. EOM are normal.  Neck: Normal range of motion. No thyromegaly present.  Cardiovascular: Normal rate and regular rhythm.  Respiratory: Effort normal. No respiratory distress.  GI: Soft. He exhibits no distension. Hernia confirmed negative in the right inguinal area and confirmed negative in the left inguinal area.  Genitourinary: Testes normal and penis normal.  Musculoskeletal: Normal range of motion. He exhibits no edema.  Lymphadenopathy:       Right: No inguinal adenopathy present.       Left: No inguinal adenopathy present.  Neurological: He is alert and oriented to person, place, and time.  Skin: Skin is warm and dry.  Psychiatric: He has a normal mood and affect. His behavior is normal. Judgment and thought content normal.    Laboratory Data:  Results for orders placed or performed during the hospital encounter of 11/24/17 (from the past 72 hour(s))  Urinalysis, Routine w reflex microscopic     Status: Abnormal   Collection Time: 11/24/17 11:37 AM  Result Value  Ref Range   Color, Urine YELLOW YELLOW   APPearance CLEAR CLEAR   Specific Gravity, Urine 1.012 1.005 - 1.030   pH 6.0 5.0 - 8.0   Glucose, UA NEGATIVE NEGATIVE mg/dL   Hgb urine dipstick MODERATE (A) NEGATIVE   Bilirubin Urine NEGATIVE NEGATIVE   Ketones, ur NEGATIVE NEGATIVE mg/dL   Protein, ur 100 (A) NEGATIVE mg/dL   Nitrite NEGATIVE NEGATIVE   Leukocytes, UA NEGATIVE NEGATIVE   RBC / HPF 21-50 0 - 5 RBC/hpf   WBC, UA 0-5 0 - 5 WBC/hpf   Bacteria,  UA NONE SEEN NONE SEEN   Squamous Epithelial / LPF 0-5 0 - 5    Comment: Performed at [facility], [place] [address], [place], [place] [address]  Lipase, blood     Status: None   Collection Time: 11/24/17 12:04 PM  Result Value Ref Range   Lipase 31 11 - 51 U/L    Comment: Performed at [facility], [place] [address], [place], [place] [address]  Comprehensive metabolic panel     Status: Abnormal   Collection Time: 11/24/17 12:04 PM  Result Value Ref Range   Sodium 138 135 - 145 mmol/L   Potassium 7.0 (HH) 3.5 - 5.1 mmol/L    Comment: NO VISIBLE HEMOLYSIS CRITICAL RESULT CALLED TO, READ BACK BY AND VERIFIED WITH: HALL,C. RN AT 1304 11/24/17 MULLINS,T    Chloride 108 98 - 111 mmol/L   CO2 20 (L) 22 - 32 mmol/L   Glucose, Bld 170 (H) 70 - 99 mg/dL   BUN 62 (H) 6 - 20 mg/dL   Creatinine, Ser 8.83 (H) 0.61 - 1.24 mg/dL   Calcium 9.2 8.9 - 10.3 mg/dL   Total Protein 8.6 (H) 6.5 - 8.1 g/dL   Albumin 4.4 3.5 - 5.0 g/dL   AST 14 (L) 15 - 41 U/L   ALT 16 0 - 44 U/L   Alkaline Phosphatase 68 38 - 126 U/L   Total Bilirubin 0.4 0.3 - 1.2 mg/dL   GFR calc non Af Amer 6 (L) >60 mL/min   GFR calc Af Amer 7 (L) >60 mL/min    Comment: (NOTE) The eGFR has been calculated using the CKD EPI equation. This calculation has not been validated in all clinical situations. eGFR's persistently <60 mL/min signify possible Chronic Kidney Disease.    Anion gap 10 5 - 15    Comment: Performed at [facility], [place] [address] [place], [place] [address]  CBC     Status: Abnormal   Collection Time: 11/24/17 12:04 PM  Result Value Ref Range   WBC 13.8 (H) 4.0 - 10.5 K/uL   RBC 4.98 4.22 - 5.81 MIL/uL   Hemoglobin 14.1 13.0 - 17.0 g/dL   HCT 43.4 39.0 - 52.0 %   MCV 87.1 80.0 - 100.0 fL   MCH 28.3 26.0 - 34.0 pg   MCHC 32.5 30.0 - 36.0 g/dL   RDW 14.2 11.5 - 15.5 %   Platelets 299 150 - 400 K/uL   nRBC 0.0 0.0 - 0.2 %    Comment: Performed at [facility], [place] [address] [place], [place] [address]  Blood gas, venous     Status: Abnormal   Collection Time: 11/24/17  1:32 PM  Result Value Ref Range   pH, Ven 7.316 7.250 - 7.430   pCO2, Ven 41.2 (L) 44.0 - 60.0 mmHg   pO2, Ven BELOW REPORTABLE RANGE 32.0 - 45.0 mmHg  Comment: CRITICAL RESULT CALLED TO, READ BACK BY AND VERIFIED WITH: M.PFEIFFER MD AT 1335 BY M.JESTER, RRT, RCP ON 11/24/2017    Bicarbonate 20.4 20.0 - 28.0 mmol/L   Acid-base deficit 5.0 (H) 0.0 - 2.0 mmol/L   O2 Saturation 44.6 %   Patient temperature 98.6    Collection site VENOUS    Drawn by DRAWN BY RN    Sample type VENOUS     Comment: Performed at [facility], [place] [address], [place], [place] [address]  I-Stat CG4 Lactic Acid, ED     Status: None   Collection Time: 11/24/17  1:51 PM  Result Value Ref Range   Lactic Acid, Venous 1.34 0.5 - 1.9 mmol/L  Magnesium     Status: None   Collection Time: 11/24/17  3:00 PM  Result Value Ref Range   Magnesium 1.7 1.7 - 2.4 mg/dL    Comment: Performed at [facility], [place] [address] [place], [place] [address]  Phosphorus     Status: None   Collection Time: 11/24/17  3:00 PM  Result Value Ref Range   Phosphorus 3.2 2.5 - 4.6 mg/dL    Comment: Performed at [facility], [place] [address], [place], [place] [address]  Ammonia     Status: Abnormal   Collection Time: 11/24/17  3:00 PM  Result Value Ref Range   Ammonia <9 (L) 9 - 35 umol/L    Comment: Performed at [facility], [place] [address] [place], [place] [address]  CBG monitoring, ED     Status: Abnormal   Collection Time: 11/24/17  4:52 PM  Result Value Ref Range   Glucose-Capillary 122 (H) 70 - 99 mg/dL   No results found for this or any previous visit (from the past 240 hour(s)). Creatinine: Recent Labs    11/23/17 1532 11/24/17 1204  CREATININE 8.67* 8.83*   Baseline Creatinine:  unknown  Impression/Assessment:  47yo with a left ureteral calculus and acute renal failure  Plan:  1. The patient's ARF is likely a combination of NSAID abuse, dehydration and his left ureteral calculus. We discussed the management of his ureteral calculus including stent placement and nephrostomy tube placement and after discussing the options the patient wishes to proceed with ureteral stent placement. The risks/benefits/alternatives to left ureteral stent placement was explained to the patient and he understands and wishes to proceed with surgery. He is schedule for surgery tomorrow late morning/early afternoon. Please make the patient NPO after midnight  [name] 11/24/2017, 7:49 PM

## 2017-11-24 NOTE — Progress Notes (Signed)
Patient is currently hospitalized for his renal failure and hyperkalemia.  His uric acid level is elevated - suggesting that his pain in his foot was gout. We can discuss further treatment options when he follows up with me after his hospitalization.   Alan Payne. Jerline Pain, MD 11/24/2017 5:25 PM

## 2017-11-24 NOTE — Progress Notes (Signed)
CRITICAL VALUE ALERT  Critical Value:  Potassium 7.0  Date & Time Notied:  11/24/17   1305  Provider Notified: Johnney Killian MD Orders Received/Actions taken: None at this time

## 2017-11-24 NOTE — ED Triage Notes (Signed)
Pt c/o left flank pain for over week. Vomiting since last night. Went to PCP today and was given shot for nausea which didn't help.

## 2017-11-24 NOTE — H&P (Signed)
History and Physical    RAINER MOUNCE YBW:[id number] DOB: 1970-02-27 DOA: 11/24/2017  I have briefly reviewed the patient's prior medical records in [place]  PCP: Vivi Barrack, MD  Patient coming from: home  Chief Complaint: Left flank pain, nausea vomiting  HPI: Alan Payne is a 47 y.o. male with medical history significant of recurrent nephrolithiasis, OSA on CPAP, type 2 diabetes mellitus, who presents to the hospital with chief complaint of left flank pain for about couple weeks.  He has a history of nephrolithiasis, he is established with urology and felt like this was due to a kidney stone.  He was seen as an outpatient couple weeks ago, and has been taking a lot of NSAIDs for his pain.  He is also been having right foot pain for the past couple of days for which he was started on colchicine.  He has been complaining of nausea vomiting and taste changes for the last week.  He denies any changes in urination.  He has had no fever or chills, no chest pain, no shortness of breath.  He denies any lightheadedness or dizziness.  ED Course: In the emergency room patient is afebrile, blood pressures in the 150s, he is satting 100% on room air.  Blood work reveals acute renal failure with a creatinine of 8.8 from prior normal values, potassium of 7.0, he has slight leukocytosis with a white count of 13.  His lactic acid is normal at 1.34.  CT scan renal stone showed a 13 mm calculus at the left UPJ with mild left hydronephrosis.  It also showed unchanged mild to moderate right renal atrophy.  Nephrology, urology were consulted.  We were asked to admit  Review of Systems: As per HPI otherwise 10 point review of systems negative.   Past Medical History:  Diagnosis Date  . Bilateral ureteral calculi   . Hydronephrosis, right   . OSA on CPAP   . Type 2 diabetes mellitus ([place])     Past Surgical History:  Procedure Laterality Date  . CIRCUMCISION  1993  . EXTRACORPOREAL SHOCK  WAVE LITHOTRIPSY Left 07-21-2014     reports that he has never smoked. He has never used smokeless tobacco. He reports that he drinks about 1.0 standard drinks of alcohol per week. He reports that he does not use drugs.  Allergies  Allergen Reactions  . Sitagliptin-Metformin Hcl Itching    Janumet    Family History  Problem Relation Age of Onset  . Kidney cancer Father   . Hypertension Father   . Cancer Father        kidney  . Diabetes Mother   . Prostate cancer Neg Hx     Prior to Admission medications   Medication Sig Start Date End Date Taking? Authorizing Provider  aspirin 81 MG tablet Take 81 mg by mouth daily.      [provider]  Colchicine 0.6 MG CAPS Day 1: Take 2 caps, then 1 cap an hour later. Day 2 and beyond: 1 cap daily Patient taking differently: Take 0.6 mg by mouth daily.  11/23/17   Vivi Barrack, MD  diclofenac (VOLTAREN) 75 MG EC tablet Take 1 tablet (75 mg total) by mouth 2 (two) times daily. 11/13/17   Vivi Barrack, MD  Ginkgo Biloba 40 MG TABS Take by mouth.    [provider]  metFORMIN (GLUCOPHAGE-XR) 750 MG 24 hr tablet TAKE 2 TABLETS BY MOUTH TWICE A DAY 11/03/17  Vivi Barrack, MD  Multiple Vitamin (MULTIVITAMIN) tablet Take 1 tablet by mouth daily.    [provider]  ondansetron (ZOFRAN ODT) 8 MG disintegrating tablet Take 1 tablet (8 mg total) by mouth every 8 (eight) hours as needed for nausea or vomiting. 11/13/17   Vivi Barrack, MD  saxagliptin HCl (ONGLYZA) 5 MG TABS tablet Take 1 tablet (5 mg total) by mouth daily. 09/06/17   Vivi Barrack, MD  tamsulosin (FLOMAX) 0.4 MG CAPS capsule Take 1 capsule (0.4 mg total) by mouth daily. 11/13/17   Vivi Barrack, MD  traMADol (ULTRAM) 50 MG tablet Take 1-2 tablets (50-100 mg total) by mouth every 8 (eight) hours as needed. 11/13/17   Vivi Barrack, MD    Physical Exam: Vitals:   11/24/17 1330 11/24/17 1336 11/24/17 1338 11/24/17 1545  BP:  128/81  (!) 154/80    Pulse: 75 89 82 77  Resp: (!) 21 19 20 20   Temp:      TempSrc:      SpO2: 100% 100% 100% 100%    Constitutional: NAD, calm, comfortable Eyes: PERRL, lids and conjunctivae normal ENMT: Mucous membranes are moist. Neck: normal, supple, no masses, no thyromegaly Respiratory: clear to auscultation bilaterally, no wheezing, no crackles. Normal respiratory effort. No accessory muscle use.  Cardiovascular: Regular rate and rhythm, no murmurs / rubs / gallops. No extremity edema. 2+ pedal pulses.  Abdomen: Slight tenderness in the left lower quadrant, no guarding or rebound. Musculoskeletal: no clubbing / cyanosis. Normal muscle tone.  Skin: no rashes, lesions, ulcers. No induration Neurologic: CN 2-12 grossly intact. Strength 5/5 in all 4.  Psychiatric: Normal judgment and insight. Alert and oriented x 3. Normal mood.   Labs on Admission: I have personally reviewed following labs and imaging studies  CBC: Recent Labs  Lab 11/23/17 1532 11/24/17 1204  WBC 9.0 13.8*  HGB 13.2 14.1  HCT 39.4 43.4  MCV 86.0 87.1  PLT 280.0 893   Basic Metabolic Panel: Recent Labs  Lab 11/23/17 1532 11/24/17 1204  NA 136 138  K 5.9* 7.0*  CL 105 108  CO2 19 20*  GLUCOSE 158* 170*  BUN 62* 62*  CREATININE 8.67* 8.83*  CALCIUM 9.2 9.2   GFR: Estimated Creatinine Clearance: 14 mL/min (A) (by C-G formula based on SCr of 8.83 mg/dL (H)). Liver Function Tests: Recent Labs  Lab 11/23/17 1532 11/24/17 1204  AST 12 14*  ALT 11 16  ALKPHOS 59 68  BILITOT 0.3 0.4  PROT 7.5 8.6*  ALBUMIN 4.1 4.4   Recent Labs  Lab 11/24/17 1204  LIPASE 31   No results for input(s): AMMONIA in the last 168 hours. Coagulation Profile: No results for input(s): INR, PROTIME in the last 168 hours. Cardiac Enzymes: No results for input(s): CKTOTAL, CKMB, CKMBINDEX, TROPONINI in the last 168 hours. BNP (last 3 results) No results for input(s): PROBNP in the last 8760 hours. HbA1C: No results for input(s):  HGBA1C in the last 72 hours. CBG: No results for input(s): GLUCAP in the last 168 hours. Lipid Profile: No results for input(s): CHOL, HDL, LDLCALC, TRIG, CHOLHDL, LDLDIRECT in the last 72 hours. Thyroid Function Tests: No results for input(s): TSH, T4TOTAL, FREET4, T3FREE, THYROIDAB in the last 72 hours. Anemia Panel: No results for input(s): VITAMINB12, FOLATE, FERRITIN, TIBC, IRON, RETICCTPCT in the last 72 hours. Urine analysis:    Component Value Date/Time   COLORURINE YELLOW 11/24/2017 [place] 11/24/2017 1137   APPEARANCEUR  Cloudy (A) 03/24/2016 1445   LABSPEC 1.012 11/24/2017 1137   PHURINE 6.0 11/24/2017 1137   GLUCOSEU NEGATIVE 11/24/2017 1137   HGBUR MODERATE (A) 11/24/2017 1137   BILIRUBINUR NEGATIVE 11/24/2017 1137   BILIRUBINUR Negative 11/13/2017 1202   BILIRUBINUR Negative 03/24/2016 1445   KETONESUR NEGATIVE 11/24/2017 1137   PROTEINUR 100 (A) 11/24/2017 1137   UROBILINOGEN 0.2 11/13/2017 1202   UROBILINOGEN 1.0 03/25/2012 1458   NITRITE NEGATIVE 11/24/2017 1137   LEUKOCYTESUR NEGATIVE 11/24/2017 1137   LEUKOCYTESUR 3+ (A) 03/24/2016 1445     Radiological Exams on Admission: Ct Renal Stone Study  Result Date: 11/24/2017 CLINICAL DATA:  Left flank pain for the past week. EXAM: CT ABDOMEN AND PELVIS WITHOUT CONTRAST TECHNIQUE: Multidetector CT imaging of the abdomen and pelvis was performed following the standard protocol without IV contrast. COMPARISON:  CT abdomen pelvis dated October 30, 2014. FINDINGS: Lower chest: No acute abnormality. Hepatobiliary: Unchanged diffuse hepatic steatosis. No focal liver abnormality. The gallbladder is unremarkable. No biliary dilatation. Pancreas: Unremarkable. No pancreatic ductal dilatation or surrounding inflammatory changes. Spleen: Normal in size without focal abnormality. Adrenals/Urinary Tract: The adrenal glands are unremarkable. Mild-to-moderate right renal atrophy is unchanged. Multiple right-sided  renal calculi measuring up to 6 mm. Multiple left-sided renal calculi measuring up to 10 mm. 13 mm calculus at the left UPJ with resultant mild left hydronephrosis. Mild circumferential bladder wall thickening may be related to underdistention. Stomach/Bowel: Stomach is within normal limits. Appendix appears normal. No evidence of bowel wall thickening, distention, or inflammatory changes. Vascular/Lymphatic: No significant vascular findings are present. No enlarged abdominal or pelvic lymph nodes. Reproductive: Prostate is unremarkable. Other: No free fluid or pneumoperitoneum. Musculoskeletal: No acute or significant osseous findings. IMPRESSION: 1. 13 mm calculus at the left UPJ with resultant mild left hydronephrosis. 2. Additional bilateral nonobstructive nephrolithiasis measuring up to 10 mm on the left and 6 mm on the right. 3. Unchanged mild to moderate right renal atrophy. 4. Hepatic steatosis. Electronically Signed   By: Titus Dubin M.D.   On: 11/24/2017 14:51    EKG: Independently reviewed.  Sinus rhythm, no significant T wave changes  Assessment/Plan Active Problems:   Acute renal failure (ARF) (HCC)   Acute kidney injury -Possibly in the setting of NSAID use, he does have a large stone however only mild hydronephrosis -Discussed with nephrology, Dr. Jonnie Finner, he will see patient in consultation. -Treat hyperkalemia with bicarbonate, dextrose/insulin, Kayexalate -Admit to stepdown -Reassuring EKGs without significant changes  Left-sided nephrolithiasis -13 mm calculus at the left UPJ, urology consulted, discussed with Dr. Noah Delaine, he will see in consultation -Pain control with tramadol renally dosed, avoid NSAIDs  Type 2 diabetes mellitus -Hold metformin, placed on sliding scale  Hypertension -Elevated BP in the ED, suspect in the setting of acute illness with pain.  Obstructive sleep apnea -Continue CPAP   DVT prophylaxis: SCDs pending urology evaluation Code Status:  Full code  Family Communication: Wife present at bedside Disposition Plan: Home when ready Consults called: Nephrology, urology    Admission status: Inpatient  At the time of admission, it appears that the appropriate admission status for this patient is INPATIENT. This is judged to be reasonable and necessary in order to provide the required high service intensity to ensure the patient's safety given the presenting symptoms, physical exam findings, and initial radiographic and laboratory data in the context of their chronic comorbidities. Current circumstances are renal failure, hyperkalemia, and it is felt to place patient at high risk for further clinical  deterioration threatening life, limb, or organ. Moreover, it is my clinical judgment that the patient will require inpatient hospital care spanning beyond 2 midnights from the point of admission and that early discharge would result in unnecessary risk of decompensation and readmission or threat to life, limb or bodily function.   Alan Board, MD [facility] Pager [phone]  If 7PM-7AM, please contact night-coverage [email or web address] Password [email or web address]  11/24/2017, 3:47 PM

## 2017-11-24 NOTE — ED Provider Notes (Signed)
[facility] DEPT Provider Note   CSN: [id number] Arrival date & time: 11/24/17  1121     History   Chief Complaint Chief Complaint  Patient presents with  . Flank Pain  . Emesis    HPI Alan Payne is a 47 y.o. male.  HPI Reports he has had history of kidney stones.  He developed left flank pain about 9 days ago.  He is Dr. checked his urine and there was blood so it seemed consistent with prior kidney stones.  Patient reports he has had many.  He started on anti-inflammatories and tramadol.  Patient also gets treated for gout.  He has a prescription for Voltaren.  He reports he was also taking ibuprofen.  I have had difficulty really identifying which medication he is taking and how much.  He describes a twice a day medication and reports taking ibuprofen but is variable and saying exactly how much and how often.  He reports he was trying to "stay ahead" of the pain because once that gets out of control he starts vomiting and cannot take any of his medications.  Per the his wife, he had a distinct change last night around midnight.  Reportedly the pain got a lot worse any started vomiting almost every 1/2 hour to hour.  He reports he could not sleep at all.  She also reports that since that time he seemed unusually sleepy and slightly confused.  In the PCP office today and referred to the emergency department.  He was given Zofran which she reports did not help his nausea.  Does identify the pain quality is being typical for multiple prior kidney stones. Past Medical History:  Diagnosis Date  . Bilateral ureteral calculi   . Hydronephrosis, right   . OSA on CPAP   . Type 2 diabetes mellitus [facility])     Patient Active Problem List   Diagnosis Date Noted  . Pain of right midfoot 11/23/2017  . Dysgeusia 11/23/2017  . Morbid obesity ([place]) 03/16/2017  . History of kidney stones 02/19/2016  . OSA (obstructive sleep apnea) 02/19/2016  . Type 2 diabetes  mellitus without complication, without long-term current use of insulin ([place]) 02/19/2016  . Nephrolithiasis 09/15/2014    Past Surgical History:  Procedure Laterality Date  . CIRCUMCISION  1993  . EXTRACORPOREAL SHOCK WAVE LITHOTRIPSY Left 07-21-2014        Home Medications    Prior to Admission medications   Medication Sig Start Date End Date Taking? Authorizing Provider  aspirin 81 MG tablet Take 81 mg by mouth daily.      [provider]  Colchicine 0.6 MG CAPS Day 1: Take 2 caps, then 1 cap an hour later. Day 2 and beyond: 1 cap daily 11/23/17   Vivi Barrack, MD  diclofenac (VOLTAREN) 75 MG EC tablet Take 1 tablet (75 mg total) by mouth 2 (two) times daily. 11/13/17   Vivi Barrack, MD  Ginkgo Biloba 40 MG TABS Take by mouth.    [provider]  metFORMIN (GLUCOPHAGE-XR) 750 MG 24 hr tablet TAKE 2 TABLETS BY MOUTH TWICE A DAY 11/03/17   Vivi Barrack, MD  Multiple Vitamin (MULTIVITAMIN) tablet Take 1 tablet by mouth daily.    [provider]  ondansetron (ZOFRAN ODT) 8 MG disintegrating tablet Take 1 tablet (8 mg total) by mouth every 8 (eight) hours as needed for nausea or vomiting. 11/13/17   Vivi Barrack, MD  saxagliptin HCl (ONGLYZA)  5 MG TABS tablet Take 1 tablet (5 mg total) by mouth daily. 09/06/17   Vivi Barrack, MD  tamsulosin (FLOMAX) 0.4 MG CAPS capsule Take 1 capsule (0.4 mg total) by mouth daily. 11/13/17   Vivi Barrack, MD  traMADol (ULTRAM) 50 MG tablet Take 1-2 tablets (50-100 mg total) by mouth every 8 (eight) hours as needed. 11/13/17   Vivi Barrack, MD    Family History Family History  Problem Relation Age of Onset  . Kidney cancer Father   . Hypertension Father   . Cancer Father        kidney  . Diabetes Mother   . Prostate cancer Neg Hx     Social History Social History   Tobacco Use  . Smoking status: Never Smoker  . Smokeless tobacco: Never Used  Substance Use Topics  . Alcohol use: Yes    Alcohol/week:  1.0 standard drinks    Types: 1 Cans of beer per week  . Drug use: No     Allergies   Sitagliptin-metformin hcl   Review of Systems Review of Systems 10 Systems reviewed and are negative for acute change except as noted in the HPI.   Physical Exam Updated Vital Signs BP (!) 190/102 (BP Location: Left Arm)   Pulse 88   Temp 98.7 F (37.1 C) (Oral)   Resp 20   SpO2 99%   Physical Exam  Constitutional:  Patient is somewhat somnolent.  He however awakens to answer questions in an oriented fashion.  No respiratory distress.  HENT:  Airway is patent.  Mucous membranes slightly dry.  Eyes:  Pupils midrange and responsive extraocular motions intact.  Neck: Neck supple.  Cardiovascular: Normal rate, regular rhythm, normal heart sounds and intact distal pulses.  Pulmonary/Chest: Effort normal and breath sounds normal.  Abdominal: Soft. He exhibits no distension. There is no tenderness. There is no guarding.  No flank pain to percussion.  No abdominal pain to palpation.  Musculoskeletal: Normal range of motion. He exhibits no edema or tenderness.  Patient does not have peripheral edema.  Skin condition of lower legs is good.  Neurological:  Patient is slightly somnolent.  When he awakens the content of his speech is normal but he does seem slightly confused.  No focal motor deficits.  I asked the patient's wife if this was something that occurred after he got the Dilaudid for pain, she reports it has started earlier yesterday when he started getting quite ill.  Patient is oriented.  He does have short-term memory recall.  Skin: Skin is warm and dry.     ED Treatments / Results  Labs (all labs ordered are listed, but only abnormal results are displayed) Labs Reviewed  LIPASE, BLOOD  COMPREHENSIVE METABOLIC PANEL  CBC  URINALYSIS, ROUTINE W REFLEX MICROSCOPIC    EKG None  Radiology No results found.  Procedures Procedures (including critical care time) CRITICAL  CARE Performed by: Charlesetta Shanks   Total critical care time: 50 minutes  Critical care time was exclusive of separately billable procedures and treating other patients.  Critical care was necessary to treat or prevent imminent or life-threatening deterioration.  Critical care was time spent personally by me on the following activities: development of treatment plan with patient and/or surrogate as well as nursing, discussions with consultants, evaluation of patient's response to treatment, examination of patient, obtaining history from patient or surrogate, ordering and performing treatments and interventions, ordering and review of laboratory studies, ordering and review of  radiographic studies, pulse oximetry and re-evaluation of patient's condition. Medications Ordered in ED Medications - No data to display   Initial Impression / Assessment and Plan / ED Course  I have reviewed the triage vital signs and the nursing notes.  Pertinent labs & imaging results that were available during my care of the patient were reviewed by me and considered in my medical decision making (see chart for details).  Clinical Course as of Dec 01 1709  Fri Nov 24, 2017  1315 Consult to nephrology ordered   [MP]  [place] ordered for hospitalist and urologist.   [MP]  1540 Consult to urology reordered.   [MP]  8938 I have placed a page to the urology pager number.   [MP]    Clinical Course User Index [MP] Charlesetta Shanks, MD   Presents with recurrent vomiting and prior diagnosis of kidney stones with history of recurrent kidney stones.  Is been trying to treat this at home.  Has been taking, likely large amounts of NSAIDs.  He is also been taking colchicine for recent flare of gout.  Diagnostic studies show acute renal failure.  Patient's case has been reviewed with Dr. Melvia Heaps of nephrology.  Tried hospitalist for admission.  Final Clinical Impressions(s) / ED Diagnoses   Final diagnoses:   Acute renal failure Mental status change  Vomiting Dehydration Hyperkalemia Kidney [facility]     ED Discharge Orders    None       Charlesetta Shanks, MD 12/01/17 [phone]

## 2017-11-24 NOTE — ED Notes (Signed)
Date and time results received: 11/24/17 1:04 PM  (use smartphrase ".now" to insert current time)  Test: potassium  Critical Value: 7.0  Name of Provider Notified:  Sonya Rn   Orders Received? Or Actions Taken?:

## 2017-11-24 NOTE — Progress Notes (Signed)
ED TO INPATIENT HANDOFF REPORT  Name/Age/Gender Alan Payne Dates 47 y.o. male  Code Status    Code Status Orders  (From admission, onward)         Start     Ordered   11/24/17 1935  Full code  Continuous     11/24/17 1934        Code Status History    This patient has a current code status but no historical code status.      Home/SNF/Other Home  Chief Complaint left flank pain  Level of Care/Admitting Diagnosis ED Disposition    ED Disposition Condition Comment   Admit  Hospital Area: [facility] [[id number]]  Level of Care: Stepdown [14]  Admit to SDU based on following criteria: Cardiac Instability:  Patients experiencing chest pain, unconfirmed MI and stable, arrhythmias and CHF requiring medical management and potentially compromising patient's stability  Diagnosis: Acute renal failure (ARF) [facility]) [[id number]]  Admitting Physician: Caren Griffins [phone]  Attending Physician: Caren Griffins [phone]  Estimated length of stay: past midnight tomorrow  Certification:: I certify this patient will need inpatient services for at least 2 midnights  PT Class (Do Not Modify): Inpatient [101]  PT Acc Code (Do Not Modify): Private [1]       Medical History Past Medical History:  Diagnosis Date  . Bilateral ureteral calculi   . Hydronephrosis, right   . OSA on CPAP   . Type 2 diabetes mellitus (HCC)     Allergies Allergies  Allergen Reactions  . Sitagliptin-Metformin Hcl Itching    Janumet    IV Location/Drains/Wounds Patient Lines/Drains/Airways Status   Active Line/Drains/Airways    Name:   Placement date:   Placement time:   Site:   Days:   Peripheral IV 11/24/17 Left Antecubital   11/24/17    1156    Antecubital   less than 1          Labs/Imaging Results for orders placed or performed during the hospital encounter of 11/24/17 (from the past 48 hour(s))  Urinalysis, Routine w reflex microscopic     Status: Abnormal   Collection  Time: 11/24/17 11:37 AM  Result Value Ref Range   Color, Urine YELLOW YELLOW   APPearance CLEAR CLEAR   Specific Gravity, Urine 1.012 1.005 - 1.030   pH 6.0 5.0 - 8.0   Glucose, UA NEGATIVE NEGATIVE mg/dL   Hgb urine dipstick MODERATE (A) NEGATIVE   Bilirubin Urine NEGATIVE NEGATIVE   Ketones, ur NEGATIVE NEGATIVE mg/dL   Protein, ur 100 (A) NEGATIVE mg/dL   Nitrite NEGATIVE NEGATIVE   Leukocytes, UA NEGATIVE NEGATIVE   RBC / HPF 21-50 0 - 5 RBC/hpf   WBC, UA 0-5 0 - 5 WBC/hpf   Bacteria, UA NONE SEEN NONE SEEN   Squamous Epithelial / LPF 0-5 0 - 5    Comment: Performed at [facility], [place] [address] [place], [place] [address]  Lipase, blood     Status: None   Collection Time: 11/24/17 12:04 PM  Result Value Ref Range   Lipase 31 11 - 51 U/L    Comment: Performed at [facility], [place] [address] [place], [place] [address]  Comprehensive metabolic panel     Status: Abnormal   Collection Time: 11/24/17 12:04 PM  Result Value Ref Range   Sodium 138 135 - 145 mmol/L   Potassium 7.0 (HH) 3.5 - 5.1 mmol/L    Comment: NO VISIBLE HEMOLYSIS CRITICAL RESULT CALLED  TO, READ BACK BY AND VERIFIED WITH: HALL,C. RN AT 1304 11/24/17 MULLINS,T    Chloride 108 98 - 111 mmol/L   CO2 20 (L) 22 - 32 mmol/L   Glucose, Bld 170 (H) 70 - 99 mg/dL   BUN 62 (H) 6 - 20 mg/dL   Creatinine, Ser 8.83 (H) 0.61 - 1.24 mg/dL   Calcium 9.2 8.9 - 10.3 mg/dL   Total Protein 8.6 (H) 6.5 - 8.1 g/dL   Albumin 4.4 3.5 - 5.0 g/dL   AST 14 (L) 15 - 41 U/L   ALT 16 0 - 44 U/L   Alkaline Phosphatase 68 38 - 126 U/L   Total Bilirubin 0.4 0.3 - 1.2 mg/dL   GFR calc non Af Amer 6 (L) >60 mL/min   GFR calc Af Amer 7 (L) >60 mL/min    Comment: (NOTE) The eGFR has been calculated using the CKD EPI equation. This calculation has not been validated in all clinical situations. eGFR's persistently <60 mL/min signify possible Chronic Kidney Disease.    Anion gap 10 5 - 15     Comment: Performed at [facility], [place] [address], [place], [place] [address]  CBC     Status: Abnormal   Collection Time: 11/24/17 12:04 PM  Result Value Ref Range   WBC 13.8 (H) 4.0 - 10.5 K/uL   RBC 4.98 4.22 - 5.81 MIL/uL   Hemoglobin 14.1 13.0 - 17.0 g/dL   HCT 43.4 39.0 - 52.0 %   MCV 87.1 80.0 - 100.0 fL   MCH 28.3 26.0 - 34.0 pg   MCHC 32.5 30.0 - 36.0 g/dL   RDW 14.2 11.5 - 15.5 %   Platelets 299 150 - 400 K/uL   nRBC 0.0 0.0 - 0.2 %    Comment: Performed at [facility], [place] [address] [place], [place] [address]  Blood gas, venous     Status: Abnormal   Collection Time: 11/24/17  1:32 PM  Result Value Ref Range   pH, Ven 7.316 7.250 - 7.430   pCO2, Ven 41.2 (L) 44.0 - 60.0 mmHg   pO2, Ven BELOW REPORTABLE RANGE 32.0 - 45.0 mmHg    Comment: CRITICAL RESULT CALLED TO, READ BACK BY AND VERIFIED WITH: M.PFEIFFER MD AT 1335 BY M.JESTER, RRT, RCP ON 11/24/2017    Bicarbonate 20.4 20.0 - 28.0 mmol/L   Acid-base deficit 5.0 (H) 0.0 - 2.0 mmol/L   O2 Saturation 44.6 %   Patient temperature 98.6    Collection site VENOUS    Drawn by DRAWN BY RN    Sample type VENOUS     Comment: Performed at [place] [facility] [address] [place], [place] [address]  I-Stat CG4 Lactic Acid, ED     Status: None   Collection Time: 11/24/17  1:51 PM  Result Value Ref Range   Lactic Acid, Venous 1.34 0.5 - 1.9 mmol/L  Magnesium     Status: None   Collection Time: 11/24/17  3:00 PM  Result Value Ref Range   Magnesium 1.7 1.7 - 2.4 mg/dL    Comment: Performed at [facility], [place] [address], [place], [place] [address]  Phosphorus     Status: None   Collection Time: 11/24/17  3:00 PM  Result Value Ref Range   Phosphorus 3.2 2.5 - 4.6 mg/dL    Comment: Performed at [facility], [place] [address], [place], [place] [address]  Ammonia     Status: Abnormal  Collection Time: 11/24/17  3:00 PM  Result Value  Ref Range   Ammonia <9 (L) 9 - 35 umol/L    Comment: Performed at [facility], [place] [address], [place], [place] [address]  CBG monitoring, ED     Status: Abnormal   Collection Time: 11/24/17  4:52 PM  Result Value Ref Range   Glucose-Capillary 122 (H) 70 - 99 mg/dL   Ct Renal Stone Study  Result Date: 11/24/2017 CLINICAL DATA:  Left flank pain for the past week. EXAM: CT ABDOMEN AND PELVIS WITHOUT CONTRAST TECHNIQUE: Multidetector CT imaging of the abdomen and pelvis was performed following the standard protocol without IV contrast. COMPARISON:  CT abdomen pelvis dated October 30, 2014. FINDINGS: Lower chest: No acute abnormality. Hepatobiliary: Unchanged diffuse hepatic steatosis. No focal liver abnormality. The gallbladder is unremarkable. No biliary dilatation. Pancreas: Unremarkable. No pancreatic ductal dilatation or surrounding inflammatory changes. Spleen: Normal in size without focal abnormality. Adrenals/Urinary Tract: The adrenal glands are unremarkable. Mild-to-moderate right renal atrophy is unchanged. Multiple right-sided renal calculi measuring up to 6 mm. Multiple left-sided renal calculi measuring up to 10 mm. 13 mm calculus at the left UPJ with resultant mild left hydronephrosis. Mild circumferential bladder wall thickening may be related to underdistention. Stomach/Bowel: Stomach is within normal limits. Appendix appears normal. No evidence of bowel wall thickening, distention, or inflammatory changes. Vascular/Lymphatic: No significant vascular findings are present. No enlarged abdominal or pelvic lymph nodes. Reproductive: Prostate is unremarkable. Other: No free fluid or pneumoperitoneum. Musculoskeletal: No acute or significant osseous findings. IMPRESSION: 1. 13 mm calculus at the left UPJ with resultant mild left hydronephrosis. 2. Additional bilateral nonobstructive nephrolithiasis measuring up to 10 mm on the left and 6 mm on the right. 3. Unchanged mild to  moderate right renal atrophy. 4. Hepatic steatosis. Electronically Signed   By: Titus Dubin M.D.   On: 11/24/2017 14:51    Pending Labs Unresulted Labs (From admission, onward)    Start     Ordered   11/25/17 2751  Basic metabolic panel  Tomorrow morning,   R     11/24/17 1821   11/24/17 1935  HIV antibody (Routine Testing)  Once,   R     11/24/17 1934   11/24/17 1900  Potassium  Once-Timed,   R     11/24/17 1820          Vitals/Pain Today's Vitals   11/24/17 1817 11/24/17 1818 11/24/17 1900 11/24/17 1930  BP: (!) 147/78 (!) 147/78    Pulse: 87 78 72 83  Resp: 20 (!) 23 19 (!) 21  Temp:      TempSrc:      SpO2: 100% 100% 100% 100%  PainSc:        Isolation Precautions No active isolations  Medications Medications  sodium chloride 0.9 % bolus 1,000 mL (0 mLs Intravenous Stopped 11/24/17 1425)    Followed by  0.9 %  sodium chloride infusion (1,000 mLs Intravenous New Bag/Given 11/24/17 1655)  sodium bicarbonate 100 mEq in dextrose 5 % 1,000 mL infusion ( Intravenous New Bag/Given 11/24/17 1704)  traMADol (ULTRAM) tablet 50 mg (has no administration in time range)  tamsulosin (FLOMAX) capsule 0.4 mg (has no administration in time range)  insulin aspart (novoLOG) injection 0-9 Units (has no administration in time range)  sodium polystyrene (KAYEXALATE) 15 GM/60ML suspension 30 g (has no administration in time range)  HYDROmorphone (DILAUDID) injection 1 mg (1 mg Intravenous Given 11/24/17 1223)  0.9 %  sodium  chloride infusion ( Intravenous Stopped 11/24/17 1839)  promethazine (PHENERGAN) injection 25 mg (25 mg Intravenous Given 11/24/17 1223)  calcium gluconate 1 g/ 50 mL sodium chloride IVPB (0 g Intravenous Stopped 11/24/17 1426)  albuterol (PROVENTIL) (2.5 MG/3ML) 0.083% nebulizer solution 10 mg (10 mg Nebulization Given 11/24/17 1338)  sodium polystyrene (KAYEXALATE) 15 GM/60ML suspension 30 g (30 g Oral Given 11/24/17 1701)  dextrose 50 % solution 50 mL (50 mLs  Intravenous Given 11/24/17 1656)  insulin aspart (novoLOG) injection 5 Units (5 Units Intravenous Given 11/24/17 1656)    Mobility walks

## 2017-11-25 ENCOUNTER — Inpatient Hospital Stay (HOSPITAL_COMMUNITY): Payer: 59 | Admitting: Certified Registered Nurse Anesthetist

## 2017-11-25 ENCOUNTER — Other Ambulatory Visit: Payer: Self-pay | Admitting: Family Medicine

## 2017-11-25 ENCOUNTER — Inpatient Hospital Stay (HOSPITAL_COMMUNITY): Payer: 59

## 2017-11-25 ENCOUNTER — Encounter (HOSPITAL_COMMUNITY)
Admission: EM | Disposition: A | Discharge status: Home / Self Care | Payer: Self-pay | Source: Ambulatory Visit | Attending: Internal Medicine

## 2017-11-25 HISTORY — PX: CYSTOSCOPY W/ URETERAL STENT PLACEMENT: SHX1429

## 2017-11-25 LAB — BASIC METABOLIC PANEL
Anion gap: 10 (ref 5–15)
Anion gap: 10 (ref 5–15)
BUN: 59 mg/dL — ABNORMAL HIGH (ref 6–20)
BUN: 38 mg/dL — ABNORMAL HIGH (ref 6–20)
CO2: 19 mmol/L — ABNORMAL LOW (ref 22–32)
CO2: 21 mmol/L — ABNORMAL LOW (ref 22–32)
Calcium: 8.4 mg/dL — ABNORMAL LOW (ref 8.9–10.3)
Calcium: 9.3 mg/dL (ref 8.9–10.3)
Chloride: 109 mmol/L (ref 98–111)
Chloride: 110 mmol/L (ref 98–111)
Creatinine, Ser: 8.61 mg/dL — ABNORMAL HIGH (ref 0.61–1.24)
Creatinine, Ser: 4.24 mg/dL — ABNORMAL HIGH (ref 0.61–1.24)
GFR calc Af Amer: 8 mL/min — ABNORMAL LOW (ref >60)
GFR calc Af Amer: 18 mL/min — ABNORMAL LOW (ref >60)
GFR calc non Af Amer: 7 mL/min — ABNORMAL LOW (ref >60)
GFR calc non Af Amer: 15 mL/min — ABNORMAL LOW (ref >60)
Glucose, Bld: 112 mg/dL — ABNORMAL HIGH (ref 70–99)
Glucose, Bld: 91 mg/dL (ref 70–99)
Potassium: 5.7 mmol/L — ABNORMAL HIGH (ref 3.5–5.1)
Potassium: 5.3 mmol/L — ABNORMAL HIGH (ref 3.5–5.1)
Sodium: 138 mmol/L (ref 135–145)
Sodium: 141 mmol/L (ref 135–145)

## 2017-11-25 LAB — POTASSIUM: Potassium: 6.8 mmol/L (ref 3.5–5.1)

## 2017-11-25 LAB — HIV ANTIBODY (ROUTINE TESTING W REFLEX): HIV Screen 4th Generation wRfx: NONREACTIVE

## 2017-11-25 LAB — GLUCOSE, CAPILLARY
Glucose-Capillary: 110 mg/dL — ABNORMAL HIGH (ref 70–99)
Glucose-Capillary: 95 mg/dL (ref 70–99)
Glucose-Capillary: 132 mg/dL — ABNORMAL HIGH (ref 70–99)
Glucose-Capillary: 97 mg/dL (ref 70–99)
Glucose-Capillary: 100 mg/dL — ABNORMAL HIGH (ref 70–99)

## 2017-11-25 LAB — MRSA PCR SCREENING: MRSA by PCR: NEGATIVE

## 2017-11-25 SURGERY — CYSTOSCOPY, WITH RETROGRADE PYELOGRAM AND URETERAL STENT INSERTION
Anesthesia: General | Site: Ureter | Laterality: Left

## 2017-11-25 MED ORDER — MIDAZOLAM HCL 5 MG/5ML IJ SOLN
INTRAMUSCULAR | Status: DC | PRN
  Administered 2017-11-25: 2 mg via INTRAVENOUS

## 2017-11-25 MED ORDER — CALCIUM GLUCONATE-NACL 1-0.675 GM/50ML-% IV SOLN
1.0000 g | Freq: Once | INTRAVENOUS | Status: AC
  Administered 2017-11-25: 1000 mg via INTRAVENOUS
  Filled 2017-11-25: qty 50

## 2017-11-25 MED ORDER — STERILE WATER FOR INJECTION IV SOLN
INTRAVENOUS | Status: DC
  Administered 2017-11-25 – 2017-11-26 (×4): via INTRAVENOUS
  Filled 2017-11-25 (×3): qty 850

## 2017-11-25 MED ORDER — COLCHICINE 0.6 MG PO TABS
0.6000 mg | ORAL_TABLET | Freq: Every day | ORAL | Status: DC
  Administered 2017-11-25 – 2017-11-27 (×3): 0.6 mg via ORAL
  Filled 2017-11-25 (×3): qty 1

## 2017-11-25 MED ORDER — INSULIN ASPART 100 UNIT/ML IV SOLN
10.0000 [IU] | Freq: Once | INTRAVENOUS | Status: AC
  Administered 2017-11-25: 10 [IU] via INTRAVENOUS
  Filled 2017-11-25 (×2): qty 0.1

## 2017-11-25 MED ORDER — SODIUM POLYSTYRENE SULFONATE 15 GM/60ML PO SUSP
45.0000 g | Freq: Once | ORAL | Status: AC
  Administered 2017-11-25: 45 g via ORAL
  Filled 2017-11-25: qty 180

## 2017-11-25 MED ORDER — HYDRALAZINE HCL 20 MG/ML IJ SOLN
20.0000 mg | Freq: Four times a day (QID) | INTRAMUSCULAR | Status: DC | PRN
  Administered 2017-11-25: 20 mg via INTRAVENOUS
  Filled 2017-11-25 (×2): qty 1

## 2017-11-25 MED ORDER — FENTANYL CITRATE (PF) 100 MCG/2ML IJ SOLN
INTRAMUSCULAR | Status: DC | PRN
  Administered 2017-11-25 (×2): 50 ug via INTRAVENOUS

## 2017-11-25 MED ORDER — PROPOFOL 10 MG/ML IV BOLUS
INTRAVENOUS | Status: AC
  Filled 2017-11-25: qty 40

## 2017-11-25 MED ORDER — MIDAZOLAM HCL 2 MG/2ML IJ SOLN
INTRAMUSCULAR | Status: AC
  Filled 2017-11-25: qty 2

## 2017-11-25 MED ORDER — PROPOFOL 10 MG/ML IV BOLUS
INTRAVENOUS | Status: DC | PRN
  Administered 2017-11-25: 250 mg via INTRAVENOUS

## 2017-11-25 MED ORDER — DEXTROSE 50 % IV SOLN
50.0000 mL | Freq: Once | INTRAVENOUS | Status: AC
  Administered 2017-11-25: 50 mL via INTRAVENOUS
  Filled 2017-11-25: qty 50

## 2017-11-25 MED ORDER — SODIUM CHLORIDE 0.9 % IV SOLN
INTRAVENOUS | Status: DC | PRN
  Administered 2017-11-25: 10:00:00 via INTRAVENOUS

## 2017-11-25 MED ORDER — CEFAZOLIN SODIUM-DEXTROSE 2-3 GM-%(50ML) IV SOLR
INTRAVENOUS | Status: DC | PRN
  Administered 2017-11-25: 2 g via INTRAVENOUS

## 2017-11-25 MED ORDER — FENTANYL CITRATE (PF) 100 MCG/2ML IJ SOLN
INTRAMUSCULAR | Status: AC
  Filled 2017-11-25: qty 2

## 2017-11-25 MED ORDER — IOHEXOL 300 MG/ML  SOLN
INTRAMUSCULAR | Status: DC | PRN
  Administered 2017-11-25: 50 mL

## 2017-11-25 MED ORDER — AMLODIPINE BESYLATE 10 MG PO TABS
10.0000 mg | ORAL_TABLET | Freq: Every day | ORAL | Status: DC
  Administered 2017-11-25 – 2017-11-27 (×3): 10 mg via ORAL
  Filled 2017-11-25 (×4): qty 1

## 2017-11-25 MED ORDER — SODIUM ZIRCONIUM CYCLOSILICATE 10 G PO PACK
10.0000 g | PACK | Freq: Every day | ORAL | Status: DC
  Administered 2017-11-25 – 2017-11-26 (×2): 10 g via ORAL
  Filled 2017-11-25 (×2): qty 1

## 2017-11-25 MED ORDER — OXYCODONE HCL 5 MG/5ML PO SOLN
5.0000 mg | Freq: Once | ORAL | Status: DC | PRN
  Filled 2017-11-25: qty 5

## 2017-11-25 MED ORDER — DEXAMETHASONE SODIUM PHOSPHATE 10 MG/ML IJ SOLN
INTRAMUSCULAR | Status: DC | PRN
  Administered 2017-11-25: 10 mg via INTRAVENOUS

## 2017-11-25 MED ORDER — OXYCODONE HCL 5 MG PO TABS: 5.0000 mg | ORAL_TABLET | Freq: Once | ORAL | Status: DC | PRN

## 2017-11-25 MED ORDER — LIDOCAINE 2% (20 MG/ML) 5 ML SYRINGE
INTRAMUSCULAR | Status: DC | PRN
  Administered 2017-11-25: 144 mg via INTRAVENOUS

## 2017-11-25 MED ORDER — LABETALOL HCL 5 MG/ML IV SOLN
10.0000 mg | INTRAVENOUS | Status: DC | PRN
  Administered 2017-11-25: 10 mg via INTRAVENOUS
  Filled 2017-11-25 (×2): qty 4

## 2017-11-25 MED ORDER — SODIUM CHLORIDE 0.9 % IR SOLN
Status: DC | PRN
  Administered 2017-11-25: 1000 mL

## 2017-11-25 MED ORDER — ONDANSETRON HCL 4 MG/2ML IJ SOLN: 4.0000 mg | Freq: Once | INTRAMUSCULAR | Status: DC | PRN

## 2017-11-25 MED ORDER — CEFAZOLIN SODIUM-DEXTROSE 2-4 GM/100ML-% IV SOLN: 2.0000 g | INTRAVENOUS | Status: DC

## 2017-11-25 MED ORDER — ONDANSETRON HCL 4 MG/2ML IJ SOLN
INTRAMUSCULAR | Status: DC | PRN
  Administered 2017-11-25: 4 mg via INTRAVENOUS

## 2017-11-25 MED ORDER — HYDRALAZINE HCL 50 MG PO TABS
50.0000 mg | ORAL_TABLET | Freq: Three times a day (TID) | ORAL | Status: DC
  Administered 2017-11-26 – 2017-11-27 (×6): 50 mg via ORAL
  Filled 2017-11-25 (×6): qty 1

## 2017-11-25 MED ORDER — CEFAZOLIN SODIUM-DEXTROSE 2-4 GM/100ML-% IV SOLN
INTRAVENOUS | Status: AC
  Filled 2017-11-25: qty 100

## 2017-11-25 MED ORDER — FENTANYL CITRATE (PF) 100 MCG/2ML IJ SOLN: 25.0000 ug | INTRAMUSCULAR | Status: DC | PRN

## 2017-11-25 MED ORDER — LACTATED RINGERS IV SOLN
INTRAVENOUS | Status: DC | PRN
  Administered 2017-11-25: 10:00:00 via INTRAVENOUS

## 2017-11-25 SURGICAL SUPPLY — 18 items
BAG URO CATCHER STRL LF (MISCELLANEOUS) ×2 IMPLANT
CATH INTERMIT  6FR 70CM (CATHETERS) ×2 IMPLANT
CLOTH BEACON ORANGE TIMEOUT ST (SAFETY) ×2 IMPLANT
COVER WAND RF STERILE (DRAPES) IMPLANT
EXTRACTOR STONE NITINOL NGAGE (UROLOGICAL SUPPLIES) IMPLANT
FIBER LASER TRAC TIP (UROLOGICAL SUPPLIES) IMPLANT
GLOVE BIO SURGEON STRL SZ8 (GLOVE) ×2 IMPLANT
GOWN STRL REUS W/TWL XL LVL3 (GOWN DISPOSABLE) ×2 IMPLANT
GUIDEWIRE ANG ZIPWIRE 038X150 (WIRE) ×2 IMPLANT
GUIDEWIRE STR DUAL SENSOR (WIRE) IMPLANT
IV NS 1000ML (IV SOLUTION) ×2
IV NS 1000ML BAXH (IV SOLUTION) ×1 IMPLANT
MANIFOLD NEPTUNE II (INSTRUMENTS) ×2 IMPLANT
PACK CYSTO (CUSTOM PROCEDURE TRAY) ×2 IMPLANT
SHEATH URETERAL 12FRX35CM (MISCELLANEOUS) IMPLANT
STENT CONTOUR 6FRX26X.038 (STENTS) IMPLANT
TUBE FEEDING 8FR 16IN STR KANG (MISCELLANEOUS) IMPLANT
TUBING CONNECTING 10 (TUBING) ×2 IMPLANT

## 2017-11-25 NOTE — Anesthesia Procedure Notes (Signed)
Procedure Name: LMA Insertion Date/Time: 11/25/2017 10:17 AM Performed by: Lind Covert, CRNA Pre-anesthesia Checklist: Patient identified, Emergency Drugs available, Suction available, Patient being monitored and Timeout performed Patient Re-evaluated:Patient Re-evaluated prior to induction Oxygen Delivery Method: Circle system utilized Preoxygenation: Pre-oxygenation with 100% oxygen Induction Type: IV induction LMA: LMA inserted LMA Size: 5.0 Tube type: Oral Number of attempts: 1 Placement Confirmation: positive ETCO2 and breath sounds checked- equal and bilateral Tube secured with: Tape Dental Injury: Teeth and Oropharynx as per pre-operative assessment

## 2017-11-25 NOTE — Progress Notes (Signed)
PROGRESS NOTE    Alan Payne  YTK:[id number] DOB: 1970-04-13 DOA: 11/24/2017 PCP: Vivi Barrack, MD   Brief Narrative: Patient is a 47 year old male with past medical history of recurrent nephrolithiasis, OSA on CPAP, type 2 diabetes mellitus who presented to the emergency department complaints of left flank pain for couple of weeks.  On presentation, patient was found to have severe acute kidney injury with creatinine of 8.8, hyperkalemia and mild leukocytosis.  CT imaging showed a 13 mm calculus at the left UPJ with mild left hydronephrosis.  Urology and nephrology following.  He is undergoing left-sided ureteral stent placement today.  Assessment & Plan:   Active Problems:   Acute renal failure (ARF) (HCC)  Acute kidney injury: Most likely in the setting of NSAIDs use, large left-sided UPJ stone.  Nephrology following.  Kidney function has not improved today that much.  Patient is having good urine output though.  Changed the fluids to sterile water with bicarb. We will continue to monitor the kidney function.  Hyperkalemia: Most likely associated with acute kidney injury.  Potassium again 5.7 this morning.  Start on [place].  Recheck potassium level this afternoon.  Left-sided nephrolithiasis: 13 mm calculus at the left UPJ.  He is undergoing left ureteral stent placement today.  Diabetes mellitus: Metformin at home.  Continue sliding-scale insulin here.  Hypertension: Blood pressure constantly high.  Started on amlodipine.  OSA: Continue CPAP.   DVT prophylaxis: SCD Code Status: Full Family Communication: Wife present at the bedside Disposition Plan: Home when ready   Consultants: Urology,nephrology  Procedures: Cystoscopy, left retro grade pyelography, left-sided stent placement.  Antimicrobials:None  Subjective:  Patient seen and examined the bedside this morning.  Currently feels comfortable.  Denies any complaints or pain.  Having good urine  output. Objective: Vitals:   11/25/17 0400 11/25/17 0527 11/25/17 0800 11/25/17 0900  BP:  (!) 172/92 (!) 182/97   Pulse:      Resp:  20 20 15   Temp: 98.7 F (37.1 C)  98 F (36.7 C)   TempSrc: Oral  Oral   SpO2:   99%   Weight:        Intake/Output Summary (Last 24 hours) at 11/25/2017 1045 Last data filed at 11/25/2017 1034 Gross per 24 hour  Intake 3974.01 ml  Output 1625 ml  Net 2349.01 ml   Filed Weights   11/24/17 2323  Weight: 133.4 kg    Examination:  General exam: Appears calm and comfortable ,Not in distress,obese HEENT:PERRL,Oral mucosa moist, Ear/Nose normal on gross exam Respiratory system: Bilateral equal air entry, normal vesicular breath sounds, no wheezes or crackles  Cardiovascular system: S1 & S2 heard, RRR. No JVD, murmurs, rubs, gallops or clicks. No pedal edema. Gastrointestinal system: Abdomen is nondistended, soft and nontender. No organomegaly or masses felt. Normal bowel sounds heard. Central nervous system: Alert and oriented. No focal neurological deficits. Extremities: No edema, no clubbing ,no cyanosis, distal peripheral pulses palpable. Skin: No rashes, lesions or ulcers,no icterus ,no pallor MSK: Normal muscle bulk,tone ,power Psychiatry: Judgement and insight appear normal. Mood & affect appropriate.     Data Reviewed: I have personally reviewed following labs and imaging studies  CBC: Recent Labs  Lab 11/23/17 1532 11/24/17 1204  WBC 9.0 13.8*  HGB 13.2 14.1  HCT 39.4 43.4  MCV 86.0 87.1  PLT 280.0 557   Basic Metabolic Panel: Recent Labs  Lab 11/23/17 1532 11/24/17 1204 11/24/17 1500 11/24/17 2030 11/25/17 0631  NA 136 138  --   --  138  K 5.9* 7.0*  --  5.5* 5.7*  CL 105 108  --   --  109  CO2 19 20*  --   --  19*  GLUCOSE 158* 170*  --   --  112*  BUN 62* 62*  --   --  59*  CREATININE 8.67* 8.83*  --   --  8.61*  CALCIUM 9.2 9.2  --   --  8.4*  MG  --   --  1.7  --   --   PHOS  --   --  3.2  --   --     GFR: Estimated Creatinine Clearance: 15 mL/min (A) (by C-G formula based on SCr of 8.61 mg/dL (H)). Liver Function Tests: Recent Labs  Lab 11/23/17 1532 11/24/17 1204  AST 12 14*  ALT 11 16  ALKPHOS 59 68  BILITOT 0.3 0.4  PROT 7.5 8.6*  ALBUMIN 4.1 4.4   Recent Labs  Lab 11/24/17 1204  LIPASE 31   Recent Labs  Lab 11/24/17 1500  AMMONIA <9*   Coagulation Profile: No results for input(s): INR, PROTIME in the last 168 hours. Cardiac Enzymes: No results for input(s): CKTOTAL, CKMB, CKMBINDEX, TROPONINI in the last 168 hours. BNP (last 3 results) No results for input(s): PROBNP in the last 8760 hours. HbA1C: No results for input(s): HGBA1C in the last 72 hours. CBG: Recent Labs  Lab 11/24/17 1652 11/24/17 2109 11/25/17 0747  GLUCAP 122* 101* 110*   Lipid Profile: No results for input(s): CHOL, HDL, LDLCALC, TRIG, CHOLHDL, LDLDIRECT in the last 72 hours. Thyroid Function Tests: No results for input(s): TSH, T4TOTAL, FREET4, T3FREE, THYROIDAB in the last 72 hours. Anemia Panel: No results for input(s): VITAMINB12, FOLATE, FERRITIN, TIBC, IRON, RETICCTPCT in the last 72 hours. Sepsis Labs: Recent Labs  Lab 11/24/17 1351  LATICACIDVEN 1.34    Recent Results (from the past 240 hour(s))  MRSA PCR Screening     Status: None   Collection Time: 11/24/17  8:22 PM  Result Value Ref Range Status   MRSA by PCR NEGATIVE NEGATIVE Final    Comment:        The GeneXpert MRSA Assay (FDA approved for NASAL specimens only), is one component of a comprehensive MRSA colonization surveillance program. It is not intended to diagnose MRSA infection nor to guide or monitor treatment for MRSA infections. Performed at [facility], [place] [address], [place], [place] [address]          Radiology Studies: Dg C-arm 1-60 Min-no Report  Result Date: 11/25/2017 Fluoroscopy was utilized by the requesting physician.  No radiographic interpretation.   Ct  Renal Stone Study  Result Date: 11/24/2017 CLINICAL DATA:  Left flank pain for the past week. EXAM: CT ABDOMEN AND PELVIS WITHOUT CONTRAST TECHNIQUE: Multidetector CT imaging of the abdomen and pelvis was performed following the standard protocol without IV contrast. COMPARISON:  CT abdomen pelvis dated October 30, 2014. FINDINGS: Lower chest: No acute abnormality. Hepatobiliary: Unchanged diffuse hepatic steatosis. No focal liver abnormality. The gallbladder is unremarkable. No biliary dilatation. Pancreas: Unremarkable. No pancreatic ductal dilatation or surrounding inflammatory changes. Spleen: Normal in size without focal abnormality. Adrenals/Urinary Tract: The adrenal glands are unremarkable. Mild-to-moderate right renal atrophy is unchanged. Multiple right-sided renal calculi measuring up to 6 mm. Multiple left-sided renal calculi measuring up to 10 mm. 13 mm calculus at the left UPJ with resultant mild left hydronephrosis. Mild circumferential bladder wall thickening may be related to underdistention. Stomach/Bowel:  Stomach is within normal limits. Appendix appears normal. No evidence of bowel wall thickening, distention, or inflammatory changes. Vascular/Lymphatic: No significant vascular findings are present. No enlarged abdominal or pelvic lymph nodes. Reproductive: Prostate is unremarkable. Other: No free fluid or pneumoperitoneum. Musculoskeletal: No acute or significant osseous findings. IMPRESSION: 1. 13 mm calculus at the left UPJ with resultant mild left hydronephrosis. 2. Additional bilateral nonobstructive nephrolithiasis measuring up to 10 mm on the left and 6 mm on the right. 3. Unchanged mild to moderate right renal atrophy. 4. Hepatic steatosis. Electronically Signed   By: Titus Dubin M.D.   On: 11/24/2017 14:51        Scheduled Meds: . [MAR Hold] insulin aspart  0-9 Units Subcutaneous TID WC  . [MAR Hold] sodium zirconium cyclosilicate  10 g Oral Daily  . [MAR Hold]  tamsulosin  0.4 mg Oral Daily   Continuous Infusions: . ceFAZolin    . [MAR Hold]  ceFAZolin (ANCEF) IV    .  sodium bicarbonate (isotonic) infusion in sterile water       LOS: 1 day    Time spent: 35 mins.More than 50% of that time was spent in counseling and/or coordination of care.      Shelly Coss, MD [facility] Pager [phone]  If 7PM-7AM, please contact night-coverage [email or web address] Password [email or web address] 11/25/2017, 10:45 AM

## 2017-11-25 NOTE — Interval H&P Note (Signed)
History and Physical Interval Note:  11/25/2017 10:09 AM  [name]  has presented today for surgery, with the diagnosis of renal failure, left ureteral stone  The various methods of treatment have been discussed with the patient and family. After consideration of risks, benefits and other options for treatment, the patient has consented to  Procedure(s): CYSTOSCOPY WITH RETROGRADE PYELOGRAM/URETERAL STENT PLACEMENT (Left) as a surgical intervention .  The patient's history has been reviewed, patient examined, no change in status, stable for surgery.  I have reviewed the patient's chart and labs.  Questions were answered to the patient's satisfaction.     Alan Payne

## 2017-11-25 NOTE — Significant Event (Addendum)
Patient K increased to 6.8 Ordered 45 gm of kayexelate,1 gm calcium gluconate,10 units of IV insulin with D50. Will check K at 8 pm.Nephrology notified.

## 2017-11-25 NOTE — Transfer of Care (Signed)
Immediate Anesthesia Transfer of Care Note  Patient: Alan Payne  Procedure(s) Performed: CYSTOSCOPY WITH RETROGRADE PYELOGRAM/URETERAL STENT PLACEMENT (Left Ureter)  Patient Location: PACU  Anesthesia Type:General  Level of Consciousness: sedated  Airway & Oxygen Therapy: Patient Spontanous Breathing and Patient connected to face mask oxygen  Post-op Assessment: Report given to RN and Post -op Vital signs reviewed and stable  Post vital signs: Reviewed and stable  Last Vitals:  Vitals Value Taken Time  BP    Temp    Pulse    Resp    SpO2      Last Pain:  Vitals:   11/25/17 0800  TempSrc: Oral  PainSc: 6       Patients Stated Pain Goal: 3 (74/71/85 5015)  Complications: No apparent anesthesia complications

## 2017-11-25 NOTE — Op Note (Signed)
.  Preoperative diagnosis: Left ureteral stone  Postoperative diagnosis: Same  Procedure: 1 cystoscopy 2. Left retrograde pyelography 3.  Intraoperative fluoroscopy, under one hour, with interpretation 4. Left 6 x 26 JJ stent placement  Attending: [name]  Anesthesia: General  Estimated blood loss: None  Drains: Left 6 x 26 JJ ureteral stent without tether  Specimens: none  Antibiotics: ancef  Findings: left proximal ureteral stone with moderate hydronephrosis. No masses/lesions in the bladder. Ureteral orifices in normal anatomic location.  Indications: Patient is a 47 year old malee with a history of left  2mm ureteral stone and renal failure.  After discussing treatment options, they decided proceed with left stent placement.  Procedure her in detail: The patient was brought to the operating room and a brief timeout was done to ensure correct patient, correct procedure, correct site.  General anesthesia was administered patient was placed in dorsal lithotomy position.  Their genitalia was then prepped and draped in usual sterile fashion.  A rigid 45 French cystoscope was passed in the urethra and the bladder.  Bladder was inspected free masses or lesions.  the ureteral orifices were in the normal orthotopic locations.  a 6 french ureteral catheter was then instilled into the left ureteral orifice.  a gentle retrograde was obtained and findings noted above.  we then placed a zip wire through the ureteral catheter and advanced up to the renal pelvis.    We then placed a 6 x 26 double-j ureteral stent over the original zip wire.  We then removed the wire and good coil was noted in the the renal pelvis under fluoroscopy and the bladder under direct vision.  A foley catheter was then placed. the bladder was then drained and this concluded the procedure which was well tolerated by patient.  Complications: None  Condition: Stable, extubated, transferred to PACU  Plan: Patient is to  be admitted for management of his acute renal failure. He will have his stone extraction in 1-2 weeks pending improvement in his renal function.

## 2017-11-25 NOTE — Anesthesia Preprocedure Evaluation (Signed)
Anesthesia Evaluation  Patient identified by MRN, date of birth, ID band Patient awake    Reviewed: NPO status , Patient's Chart, lab work & pertinent test results  Airway Mallampati: III  TM Distance: >3 FB Neck ROM: Full    Dental  (+) Teeth Intact, Dental Advisory Given   Pulmonary    breath sounds clear to auscultation       Cardiovascular  Rhythm:Regular Rate:Normal     Neuro/Psych    GI/Hepatic   Endo/Other  diabetes  Renal/GU      Musculoskeletal   Abdominal (+) + obese,   Peds  Hematology   Anesthesia Other Findings   Reproductive/Obstetrics                             Anesthesia Physical Anesthesia Plan  ASA: III  Anesthesia Plan: General   Post-op Pain Management:    Induction: Intravenous  PONV Risk Score and Plan: Ondansetron  Airway Management Planned: LMA  Additional Equipment:   Intra-op Plan:   Post-operative Plan:   Informed Consent: I have reviewed the patients History and Physical, chart, labs and discussed the procedure including the risks, benefits and alternatives for the proposed anesthesia with the patient or authorized representative who has indicated his/her understanding and acceptance.   Dental advisory given  Plan Discussed with: CRNA and Anesthesiologist  Anesthesia Plan Comments:         Anesthesia Quick Evaluation

## 2017-11-25 NOTE — Anesthesia Postprocedure Evaluation (Signed)
Anesthesia Post Note  Patient: Alan Payne  Procedure(s) Performed: CYSTOSCOPY WITH RETROGRADE PYELOGRAM/URETERAL STENT PLACEMENT (Left Ureter)     Patient location during evaluation: PACU Anesthesia Type: General Level of consciousness: awake and alert Pain management: pain level controlled Vital Signs Assessment: post-procedure vital signs reviewed and stable Respiratory status: spontaneous breathing, nonlabored ventilation, respiratory function stable and patient connected to nasal cannula oxygen Cardiovascular status: stable Postop Assessment: no apparent nausea or vomiting Anesthetic complications: no    Last Vitals:  Vitals:   11/25/17 1100 11/25/17 1115  BP: (!) 161/110 (!) 175/106  Pulse: (!) 101 (!) 101  Resp: (!) 21 (!) 23  Temp:  36.8 C  SpO2: 98% 98%    Last Pain:  Vitals:   11/25/17 1115  TempSrc:   PainSc: Asleep                 JOSLIN,DAVID COKER

## 2017-11-25 NOTE — Progress Notes (Signed)
[facility] Progress Note  Subjective: 1.3 L out yest and 2.9 so far UOP today.  Had L ureteral stent placed earlier today.  Creat from early am is still up at 8.6  Vitals:   11/25/17 1500 11/25/17 1600 11/25/17 1607 11/25/17 1700  BP: (!) 196/100 (!) 177/89  (!) 194/89  Pulse: (!) 108 (!) 105  (!) 103  Resp: 17 (!) 26  20  Temp:   98.8 F (37.1 C)   TempSrc:   Oral   SpO2: 99% 99%  98%  Weight:        Inpatient medications: . amLODipine  10 mg Oral Daily  . colchicine  0.6 mg Oral Daily  . insulin aspart  0-9 Units Subcutaneous TID WC  . sodium zirconium cyclosilicate  10 g Oral Daily  . tamsulosin  0.4 mg Oral Daily   . calcium gluconate 1,000 mg (11/25/17 1629)  . ceFAZolin    .  ceFAZolin (ANCEF) IV    .  sodium bicarbonate (isotonic) infusion in sterile water 125 mL/hr at 11/25/17 1200   hydrALAZINE, labetalol, traMADol  Iron/TIBC/Ferritin/ %Sat No results found for: IRON, TIBC, FERRITIN, IRONPCTSAT  Exam: Gen alert, no distress  No jvd or bruits  Chest clear bilat RRR no MRG Abd soft ntnd no mass or ascites +bs GU normal male Ext no LEor UE  edema Neuro is alert, Ox 3 , nf, calm    Impression/ Plan: 1. AKI - combination vol depletion/ obstructed L kidney/ atrophic L kidney and NSAID's.  SP L ureteral stent placement this am.  Good UOP. Creat still up. Cont IVF's . No indication for dialysis.  2. Hyperkalemia - needs low K / renal diet, will switch.  Lokelma ordered as well, and also has gotten another 45gm kayexalate today.  3. H/o kidney stones, 100% uric acid per pt 4. DM2 - not on insulin 5. Vol depletion - improving      Kelly Splinter MD [facility] pager [phone]   11/25/2017, 5:18 PM   Recent Labs  Lab 11/23/17 1532 11/24/17 1204 11/24/17 1500  11/25/17 0631 11/25/17 1436  NA 136 138  --   --  138  --   K 5.9* 7.0*  --    < > 5.7* 6.8*  CL 105 108  --   --  109  --   CO2 19 20*  --   --  19*  --    GLUCOSE 158* 170*  --   --  112*  --   BUN 62* 62*  --   --  59*  --   CREATININE 8.67* 8.83*  --   --  8.61*  --   CALCIUM 9.2 9.2  --   --  8.4*  --   PHOS  --   --  3.2  --   --   --   ALBUMIN 4.1 4.4  --   --   --   --    < > = values in this interval not displayed.   Recent Labs  Lab 11/23/17 1532 11/24/17 1204  AST 12 14*  ALT 11 16  ALKPHOS 59 68  BILITOT 0.3 0.4  PROT 7.5 8.6*   Recent Labs  Lab 11/23/17 1532 11/24/17 1204  WBC 9.0 13.8*  HGB 13.2 14.1  HCT 39.4 43.4  MCV 86.0 87.1  PLT 280.0 299

## 2017-11-26 ENCOUNTER — Encounter (HOSPITAL_COMMUNITY): Payer: Self-pay | Admitting: Urology

## 2017-11-26 DIAGNOSIS — E1159 Type 2 diabetes mellitus with other circulatory complications: Secondary | ICD-10-CM

## 2017-11-26 DIAGNOSIS — I10 Essential (primary) hypertension: Secondary | ICD-10-CM

## 2017-11-26 LAB — CBC WITH DIFFERENTIAL/PLATELET
Abs Immature Granulocytes: 0.05 10*3/uL (ref 0.00–0.07)
Basophils Absolute: 0 10*3/uL (ref 0.0–0.1)
Basophils Relative: 0 %
Eosinophils Absolute: 0.4 10*3/uL (ref 0.0–0.5)
Eosinophils Relative: 4 %
HCT: 41.7 % (ref 39.0–52.0)
Hemoglobin: 13.2 g/dL (ref 13.0–17.0)
Immature Granulocytes: 1 %
Lymphocytes Relative: 14 %
Lymphs Abs: 1.4 10*3/uL (ref 0.7–4.0)
MCH: 28 pg (ref 26.0–34.0)
MCHC: 31.7 g/dL (ref 30.0–36.0)
MCV: 88.5 fL (ref 80.0–100.0)
Monocytes Absolute: 1.1 10*3/uL — ABNORMAL HIGH (ref 0.1–1.0)
Monocytes Relative: 10 %
Neutro Abs: 7.2 10*3/uL (ref 1.7–7.7)
Neutrophils Relative %: 71 %
Platelets: 257 10*3/uL (ref 150–400)
RBC: 4.71 MIL/uL (ref 4.22–5.81)
RDW: 14.5 % (ref 11.5–15.5)
WBC: 10.2 10*3/uL (ref 4.0–10.5)
nRBC: 0 % (ref 0.0–0.2)

## 2017-11-26 LAB — GLUCOSE, CAPILLARY
Glucose-Capillary: 112 mg/dL — ABNORMAL HIGH (ref 70–99)
Glucose-Capillary: 200 mg/dL — ABNORMAL HIGH (ref 70–99)
Glucose-Capillary: 124 mg/dL — ABNORMAL HIGH (ref 70–99)
Glucose-Capillary: 144 mg/dL — ABNORMAL HIGH (ref 70–99)

## 2017-11-26 LAB — BASIC METABOLIC PANEL
Anion gap: 9 (ref 5–15)
BUN: 32 mg/dL — ABNORMAL HIGH (ref 6–20)
CO2: 23 mmol/L (ref 22–32)
Calcium: 8.6 mg/dL — ABNORMAL LOW (ref 8.9–10.3)
Chloride: 108 mmol/L (ref 98–111)
Creatinine, Ser: 3.09 mg/dL — ABNORMAL HIGH (ref 0.61–1.24)
GFR calc Af Amer: 26 mL/min — ABNORMAL LOW (ref >60)
GFR calc non Af Amer: 22 mL/min — ABNORMAL LOW (ref >60)
Glucose, Bld: 119 mg/dL — ABNORMAL HIGH (ref 70–99)
Potassium: 5.2 mmol/L — ABNORMAL HIGH (ref 3.5–5.1)
Sodium: 140 mmol/L (ref 135–145)

## 2017-11-26 MED ORDER — ACETAMINOPHEN-CODEINE #3 300-30 MG PO TABS
1.0000 | ORAL_TABLET | Freq: Four times a day (QID) | ORAL | Status: DC | PRN
  Administered 2017-11-26 – 2017-11-27 (×2): 1 via ORAL
  Filled 2017-11-26 (×2): qty 1

## 2017-11-26 NOTE — Progress Notes (Signed)
[facility] Progress Note  Subjective: 6L UOP yest , has HA today o/w no c/o.    Vitals:   11/26/17 0400 11/26/17 0800 11/26/17 0900 11/26/17 1000  BP: (!) 143/78 (!) 157/99 (!) 177/95 (!) 163/85  Pulse: 98 (!) 104 (!) 103 (!) 109  Resp: 20 19 13 17   Temp:      TempSrc:      SpO2: 98% 96% 92% 96%  Weight:        Inpatient medications: . amLODipine  10 mg Oral Daily  . colchicine  0.6 mg Oral Daily  . hydrALAZINE  50 mg Oral Q8H  . insulin aspart  0-9 Units Subcutaneous TID WC  . tamsulosin  0.4 mg Oral Daily   .  ceFAZolin (ANCEF) IV     acetaminophen-codeine, hydrALAZINE  Iron/TIBC/Ferritin/ %Sat No results found for: IRON, TIBC, FERRITIN, IRONPCTSAT  Exam: Gen alert, no distress  No jvd or bruits  Chest clear bilat RRR no MRG Abd soft ntnd no mass or ascites +bs GU normal male Ext no LEor UE  edema Neuro is alert, Ox 3 , nf, calm    Impression/ Plan: 1. AKI - combination vol depletion/ obstructed L kidney and NSAID's. L ureter stent placed 10/19, excellent UOP and creat down 8 > 4 today. Should cont to recover. Will sign off, call w/ any questions.  2. Hyperkalemia - resolving w/ better kidney fxn. Can dc lokelma.  3. H/o kidney stones 4. DM2 - non insulin dependent 5. Vol depletion - improving  6. HTN - new issue here, avoid acei/ ARB, ok to use CCB/ BB/ hydral, etc.    Kelly Splinter MD [facility] pager [phone]   11/26/2017, 11:10 AM   Recent Labs  Lab 11/23/17 1532 11/24/17 1204 11/24/17 1500  11/25/17 2045 11/26/17 0404  NA 136 138  --    < > 141 140  K 5.9* 7.0*  --    < > 5.3* 5.2*  CL 105 108  --    < > 110 108  CO2 19 20*  --    < > 21* 23  GLUCOSE 158* 170*  --    < > 91 119*  BUN 62* 62*  --    < > 38* 32*  CREATININE 8.67* 8.83*  --    < > 4.24* 3.09*  CALCIUM 9.2 9.2  --    < > 9.3 8.6*  PHOS  --   --  3.2  --   --   --   ALBUMIN 4.1 4.4  --   --   --   --    < > = values in this interval not  displayed.   Recent Labs  Lab 11/23/17 1532 11/24/17 1204  AST 12 14*  ALT 11 16  ALKPHOS 59 68  BILITOT 0.3 0.4  PROT 7.5 8.6*   Recent Labs  Lab 11/24/17 1204 11/26/17 0404  WBC 13.8* 10.2  NEUTROABS  --  7.2  HGB 14.1 13.2  HCT 43.4 41.7  MCV 87.1 88.5  PLT 299 257

## 2017-11-26 NOTE — Progress Notes (Signed)
RT set CPAP up for patient . Patient will self administer when ready for bed

## 2017-11-26 NOTE — Progress Notes (Signed)
1 Day Post-Op Subjective: Patient reports  Resolution of his left flank pain. He has urinary urgency and frequency. 6.5L UOP in the past 24 hours. Creatinine improved to 3  Objective: Vital signs in last 24 hours: Temp:  [98.5 F (36.9 C)-98.9 F (37.2 C)] 98.6 F (37 C) (10/20 1259) Pulse Rate:  [95-119] 115 (10/20 1500) Resp:  [13-22] 22 (10/20 1259) BP: (143-190)/(72-101) 158/72 (10/20 1500) SpO2:  [92 %-98 %] 98 % (10/20 1259)  Intake/Output from previous day: 10/19 0701 - 10/20 0700 In: 2660.7 [I.V.:2619.5; IV Piggyback:41.2] Out: 6520 [Urine:6520] Intake/Output this shift: Total I/O In: -  Out: 375 [Urine:375]  Physical Exam:  General:alert, cooperative and appears stated age GI: soft, non tender, normal bowel sounds, no palpable masses, no organomegaly, no inguinal hernia Male genitalia: not done Extremities: extremities normal, atraumatic, no cyanosis or edema  Lab Results: Recent Labs    11/24/17 1204 11/26/17 0404  HGB 14.1 13.2  HCT 43.4 41.7   BMET Recent Labs    11/25/17 2045 11/26/17 0404  NA 141 140  K 5.3* 5.2*  CL 110 108  CO2 21* 23  GLUCOSE 91 119*  BUN 38* 32*  CREATININE 4.24* 3.09*  CALCIUM 9.3 8.6*   No results for input(s): LABPT, INR in the last 72 hours. No results for input(s): LABURIN in the last 72 hours. Results for orders placed or performed during the hospital encounter of 11/24/17  MRSA PCR Screening     Status: None   Collection Time: 11/24/17  8:22 PM  Result Value Ref Range Status   MRSA by PCR NEGATIVE NEGATIVE Final    Comment:        The GeneXpert MRSA Assay (FDA approved for NASAL specimens only), is one component of a comprehensive MRSA colonization surveillance program. It is not intended to diagnose MRSA infection nor to guide or monitor treatment for MRSA infections. Performed at [facility], [place] [address] [place], [place] [address]     Studies/Results: Dg C-arm 1-60 Min-no  Report  Result Date: 11/25/2017 Fluoroscopy was utilized by the requesting physician.  No radiographic interpretation.    Assessment/Plan: POD#1 left ureteral stent placement 1. Continue to trend creatinine. It will likely normalize in the next 1-2 days 2. The patient will be scheduled as an outpatient for left ureteroscopic stone extraction   LOS: 2 days   [name] 11/26/2017, 5:27 PM

## 2017-11-26 NOTE — Progress Notes (Signed)
Patient received as transfer from ICU.  Patient placed on telemetry and confirmed with CMT.  Oriented to unit and equipment.  Wife at bedside, patient stable at this time. Agree with previous RN's assessment of patient.

## 2017-11-26 NOTE — Progress Notes (Addendum)
PROGRESS NOTE    Alan Payne  XBD:[id number] DOB: 1970-08-17 DOA: 11/24/2017 PCP: Vivi Barrack, MD   Brief Narrative: Patient is a 47 year old male with past medical history of recurrent nephrolithiasis, OSA on CPAP, type 2 diabetes mellitus who presented to the emergency department complaints of left flank pain for couple of weeks.  On presentation, patient was found to have severe acute kidney injury with creatinine of 8.8, hyperkalemia and mild leukocytosis.  CT imaging showed a 13 mm calculus at the left UPJ with mild left hydronephrosis.  Urology and nephrology were following.  He  underwent left-sided ureteral stent placement on 11/25/17.  Kidney function improving.  Assessment & Plan:   Principal Problem:   Acute renal failure (ARF) (HCC) Active Problems:   History of kidney stones   OSA (obstructive sleep apnea)   Left renal stone   Type 2 diabetes mellitus without complication, without long-term current use of insulin (HCC)   HTN (hypertension)  Acute kidney injury: Most likely in the setting of NSAIDs use and large left-sided UPJ stone.  Nephrology was following.  Kidney function has significantly improved this morning.  He is having good urine output .  We will discontinue the fluid. We will check BMP tomorrow.  Hyperkalemia: Most likely associated with acute kidney injury.  Potassium again 5.2 this morning.    Recheck potassium level this afternoon.Anticipate improvement.  Left-sided nephrolithiasis: 13 mm calculus at the left UPJ.  He  underwent left ureteral stent placement .  We will follow-up with urology on discharge for stone extraction. Complained of urinary urgency and frequency ,will check urine culture.  Diabetes mellitus: Metformin at home.  Continue sliding-scale insulin here.  Hypertension: Blood pressure still on higher side.  Started on amlodipine and hydralazine.  I suspect this the current elevated blood pressure secondary to his acute issues going  on.  If blood pressure does not improve tomorrow AM, we may need to increase the dose of hydralazine or add some beta-blockers.  OSA: Continue CPAP.  Headache: We will discontinue tramadol and start on Tylenol # 3.  Sinus tachycardia:EKG confirms sinus tachycardia. Will check TSH. If remains Hypertensive and sinus tachy ,please  consider beta blocker tomorrow.He denies any pain and he has got plenty of fluids so doubt dehydration.He said he had issues with tachycardia in the past.Will check Echocardiogram.   DVT prophylaxis: SCD Code Status: Full Family Communication: Wife present at the bedside Disposition Plan: Home in 1-2 days   Consultants: Urology,nephrology  Procedures: Cystoscopy, left retro grade pyelography, left-sided stent placement.  Antimicrobials:None  Subjective:  Patient seen and examined the bedside this morning.  He is having good urine output.  He was bothered by headache this morning which improved later in the day.  Objective: Vitals:   11/26/17 0900 11/26/17 1000 11/26/17 1142 11/26/17 1259  BP: (!) 177/95 (!) 163/85 (!) 164/83 (!) 157/101  Pulse: (!) 103 (!) 109  (!) 102  Resp: 13 17  (!) 22  Temp:    98.6 F (37 C)  TempSrc:    Oral  SpO2: 92% 96%  98%  Weight:        Intake/Output Summary (Last 24 hours) at 11/26/2017 1322 Last data filed at 11/26/2017 0600 Gross per 24 hour  Intake 2086.76 ml  Output 4750 ml  Net -2663.24 ml   Filed Weights   11/24/17 2323  Weight: 133.4 kg    Examination:  General exam: Appears calm and comfortable ,Not in distress,obese HEENT:PERRL,Oral mucosa moist,  Ear/Nose normal on gross exam Respiratory system: Bilateral equal air entry, normal vesicular breath sounds, no wheezes or crackles  Cardiovascular system: S1 & S2 heard, sinus tachy. No JVD, murmurs, rubs, gallops or clicks. Gastrointestinal system: Abdomen is nondistended, soft and nontender. No organomegaly or masses felt. Normal bowel sounds  heard. Central nervous system: Alert and oriented. No focal neurological deficits. Extremities: No edema, no clubbing ,no cyanosis, distal peripheral pulses palpable. Skin: No rashes, lesions or ulcers,no icterus ,no pallor MSK: Normal muscle bulk,tone ,power Psychiatry: Judgement and insight appear normal. Mood & affect appropriate.    Data Reviewed: I have personally reviewed following labs and imaging studies  CBC: Recent Labs  Lab 11/23/17 1532 11/24/17 1204 11/26/17 0404  WBC 9.0 13.8* 10.2  NEUTROABS  --   --  7.2  HGB 13.2 14.1 13.2  HCT 39.4 43.4 41.7  MCV 86.0 87.1 88.5  PLT 280.0 299 992   Basic Metabolic Panel: Recent Labs  Lab 11/23/17 1532 11/24/17 1204 11/24/17 1500 11/24/17 2030 11/25/17 0631 11/25/17 1436 11/25/17 2045 11/26/17 0404  NA 136 138  --   --  138  --  141 140  K 5.9* 7.0*  --  5.5* 5.7* 6.8* 5.3* 5.2*  CL 105 108  --   --  109  --  110 108  CO2 19 20*  --   --  19*  --  21* 23  GLUCOSE 158* 170*  --   --  112*  --  91 119*  BUN 62* 62*  --   --  59*  --  38* 32*  CREATININE 8.67* 8.83*  --   --  8.61*  --  4.24* 3.09*  CALCIUM 9.2 9.2  --   --  8.4*  --  9.3 8.6*  MG  --   --  1.7  --   --   --   --   --   PHOS  --   --  3.2  --   --   --   --   --    GFR: Estimated Creatinine Clearance: 41.8 mL/min (A) (by C-G formula based on SCr of 3.09 mg/dL (H)). Liver Function Tests: Recent Labs  Lab 11/23/17 1532 11/24/17 1204  AST 12 14*  ALT 11 16  ALKPHOS 59 68  BILITOT 0.3 0.4  PROT 7.5 8.6*  ALBUMIN 4.1 4.4   Recent Labs  Lab 11/24/17 1204  LIPASE 31   Recent Labs  Lab 11/24/17 1500  AMMONIA <9*   Coagulation Profile: No results for input(s): INR, PROTIME in the last 168 hours. Cardiac Enzymes: No results for input(s): CKTOTAL, CKMB, CKMBINDEX, TROPONINI in the last 168 hours. BNP (last 3 results) No results for input(s): PROBNP in the last 8760 hours. HbA1C: No results for input(s): HGBA1C in the last 72  hours. CBG: Recent Labs  Lab 11/25/17 1642 11/25/17 1748 11/25/17 2112 11/26/17 0800 11/26/17 1153  GLUCAP 132* 97 100* 112* 200*   Lipid Profile: No results for input(s): CHOL, HDL, LDLCALC, TRIG, CHOLHDL, LDLDIRECT in the last 72 hours. Thyroid Function Tests: No results for input(s): TSH, T4TOTAL, FREET4, T3FREE, THYROIDAB in the last 72 hours. Anemia Panel: No results for input(s): VITAMINB12, FOLATE, FERRITIN, TIBC, IRON, RETICCTPCT in the last 72 hours. Sepsis Labs: Recent Labs  Lab 11/24/17 1351  LATICACIDVEN 1.34    Recent Results (from the past 240 hour(s))  MRSA PCR Screening     Status: None   Collection Time: 11/24/17  8:22  PM  Result Value Ref Range Status   MRSA by PCR NEGATIVE NEGATIVE Final    Comment:        The GeneXpert MRSA Assay (FDA approved for NASAL specimens only), is one component of a comprehensive MRSA colonization surveillance program. It is not intended to diagnose MRSA infection nor to guide or monitor treatment for MRSA infections. Performed at [facility], [place] [address] [place], [place] [address]          Radiology Studies: Dg C-arm 1-60 Min-no Report  Result Date: 11/25/2017 Fluoroscopy was utilized by the requesting physician.  No radiographic interpretation.   Ct Renal Stone Study  Result Date: 11/24/2017 CLINICAL DATA:  Left flank pain for the past week. EXAM: CT ABDOMEN AND PELVIS WITHOUT CONTRAST TECHNIQUE: Multidetector CT imaging of the abdomen and pelvis was performed following the standard protocol without IV contrast. COMPARISON:  CT abdomen pelvis dated October 30, 2014. FINDINGS: Lower chest: No acute abnormality. Hepatobiliary: Unchanged diffuse hepatic steatosis. No focal liver abnormality. The gallbladder is unremarkable. No biliary dilatation. Pancreas: Unremarkable. No pancreatic ductal dilatation or surrounding inflammatory changes. Spleen: Normal in size without focal abnormality.  Adrenals/Urinary Tract: The adrenal glands are unremarkable. Mild-to-moderate right renal atrophy is unchanged. Multiple right-sided renal calculi measuring up to 6 mm. Multiple left-sided renal calculi measuring up to 10 mm. 13 mm calculus at the left UPJ with resultant mild left hydronephrosis. Mild circumferential bladder wall thickening may be related to underdistention. Stomach/Bowel: Stomach is within normal limits. Appendix appears normal. No evidence of bowel wall thickening, distention, or inflammatory changes. Vascular/Lymphatic: No significant vascular findings are present. No enlarged abdominal or pelvic lymph nodes. Reproductive: Prostate is unremarkable. Other: No free fluid or pneumoperitoneum. Musculoskeletal: No acute or significant osseous findings. IMPRESSION: 1. 13 mm calculus at the left UPJ with resultant mild left hydronephrosis. 2. Additional bilateral nonobstructive nephrolithiasis measuring up to 10 mm on the left and 6 mm on the right. 3. Unchanged mild to moderate right renal atrophy. 4. Hepatic steatosis. Electronically Signed   By: Titus Dubin M.D.   On: 11/24/2017 14:51        Scheduled Meds: . amLODipine  10 mg Oral Daily  . colchicine  0.6 mg Oral Daily  . hydrALAZINE  50 mg Oral Q8H  . insulin aspart  0-9 Units Subcutaneous TID WC  . tamsulosin  0.4 mg Oral Daily   Continuous Infusions: .  ceFAZolin (ANCEF) IV       LOS: 2 days    Time spent: 25 mins.More than 50% of that time was spent in counseling and/or coordination of care.      Shelly Coss, MD [facility] Pager [phone]  If 7PM-7AM, please contact night-coverage [email or web address] Password [email or web address] 11/26/2017, 1:22 PM

## 2017-11-27 ENCOUNTER — Inpatient Hospital Stay (HOSPITAL_COMMUNITY): Payer: 59

## 2017-11-27 DIAGNOSIS — E119 Type 2 diabetes mellitus without complications: Secondary | ICD-10-CM

## 2017-11-27 DIAGNOSIS — N200 Calculus of kidney: Secondary | ICD-10-CM

## 2017-11-27 DIAGNOSIS — I5030 Unspecified diastolic (congestive) heart failure: Secondary | ICD-10-CM

## 2017-11-27 DIAGNOSIS — R000 Tachycardia, unspecified: Secondary | ICD-10-CM | POA: Diagnosis present

## 2017-11-27 DIAGNOSIS — R110 Nausea: Secondary | ICD-10-CM

## 2017-11-27 DIAGNOSIS — I5189 Other ill-defined heart diseases: Secondary | ICD-10-CM

## 2017-11-27 DIAGNOSIS — I517 Cardiomegaly: Secondary | ICD-10-CM

## 2017-11-27 HISTORY — DX: Cardiomegaly: I51.7

## 2017-11-27 HISTORY — DX: Other ill-defined heart diseases: I51.89

## 2017-11-27 LAB — BASIC METABOLIC PANEL
Anion gap: 7 (ref 5–15)
BUN: 22 mg/dL — ABNORMAL HIGH (ref 6–20)
CO2: 24 mmol/L (ref 22–32)
Calcium: 9 mg/dL (ref 8.9–10.3)
Chloride: 109 mmol/L (ref 98–111)
Creatinine, Ser: 1.79 mg/dL — ABNORMAL HIGH (ref 0.61–1.24)
GFR calc Af Amer: 50 mL/min — ABNORMAL LOW (ref >60)
GFR calc non Af Amer: 43 mL/min — ABNORMAL LOW (ref >60)
Glucose, Bld: 131 mg/dL — ABNORMAL HIGH (ref 70–99)
Potassium: 4.5 mmol/L (ref 3.5–5.1)
Sodium: 140 mmol/L (ref 135–145)

## 2017-11-27 LAB — ECHOCARDIOGRAM COMPLETE: Weight: 4705.5 oz

## 2017-11-27 LAB — GLUCOSE, CAPILLARY
Glucose-Capillary: 133 mg/dL — ABNORMAL HIGH (ref 70–99)
Glucose-Capillary: 176 mg/dL — ABNORMAL HIGH (ref 70–99)

## 2017-11-27 LAB — TSH: TSH: 1.219 u[IU]/mL (ref 0.350–4.500)

## 2017-11-27 MED ORDER — METOPROLOL TARTRATE 25 MG PO TABS: 25.0000 mg | ORAL_TABLET | Freq: Two times a day (BID) | ORAL | 0 refills | Status: DC

## 2017-11-27 MED ORDER — HYDRALAZINE HCL 50 MG PO TABS: 50.0000 mg | ORAL_TABLET | Freq: Three times a day (TID) | ORAL | 0 refills | Status: DC

## 2017-11-27 MED ORDER — METOPROLOL TARTRATE 25 MG PO TABS
25.0000 mg | ORAL_TABLET | Freq: Two times a day (BID) | ORAL | Status: DC
  Administered 2017-11-27: 25 mg via ORAL
  Filled 2017-11-27: qty 1

## 2017-11-27 MED ORDER — ACETAMINOPHEN-CODEINE #3 300-30 MG PO TABS: 1.0000 | ORAL_TABLET | Freq: Four times a day (QID) | ORAL | 0 refills | Status: DC | PRN

## 2017-11-27 MED ORDER — AMLODIPINE BESYLATE 10 MG PO TABS: 10.0000 mg | ORAL_TABLET | Freq: Every day | ORAL | 0 refills | Status: DC

## 2017-11-27 NOTE — Progress Notes (Signed)
  Echocardiogram 2D Echocardiogram has been performed.  Alan Payne 11/27/2017, 10:57 AM

## 2017-11-27 NOTE — Discharge Summary (Signed)
Discharge Summary  Alan Payne:[id number] DOB: [birth year]  PCP: Vivi Barrack, MD  Admit date: 11/24/2017 Discharge date: 11/27/2017   Time spent: < 25 minutes  Admitted From: home Disposition:  home  Recommendations for Outpatient Follow-up:  1. Follow up with PCP in 1 week 2. Start amlodipine 10 mg daily, hydralazine 50 mg every 8 hours for BP 3. Metoprolol 25 mg twice daily for tachycardia 4. Urology to arrange outpatient follow in 1-2 weeks for stone extraction once kidney function stabilizes 5. Needs repeat BMP on PCP follow up   Discharge Diagnoses:  Active Hospital Problems   Diagnosis Date Noted  . Acute renal failure (ARF) ([place]) 11/24/2017  . Sinus tachycardia 11/27/2017  . HTN (hypertension) 11/26/2017  . History of kidney stones 02/19/2016  . OSA (obstructive sleep apnea) 02/19/2016  . Type 2 diabetes mellitus without complication, without long-term current use of insulin ([place]) 02/19/2016  . Left renal stone 09/15/2014    Resolved Hospital Problems  No resolved problems to display.    Discharge Condition: stable   CODE STATUS: full code  History of present illness:  Alan Payne is a 47 y.o. year old male with medical history significant for recurrent nephrolithiasis, OSA on CPAP, type 2 diabetes who presented on 11/24/2017 with nausea, vomiting and left flank pain and was found to have a 13 mm calculus at the left UPJ with resultant mild left hydronephrosis on CT scan consistent with nephrolithiasis and acute renal failure,.. Remaining hospital course addressed in problem based format below:   Hospital Course:   Left-sided nephrolithiasis, status post left ureteral stent placement (11/25/2017).  Underwent stent placement in hospital.  Urology plans to have stone extraction 1 to 2 weeks after renal function returns to baseline.  Acute kidney insufficiency, improving.  Likely multifactorial etiology prerenal (volume depletion from vomiting)  disease and postobstructive (related to left kidney stone) and NSAIDs.  After stent placement had excellent urine output creatinine has down trended from 8-1.64 on day of discharge.  Need repeat BMP to determine new baseline.  Encouraged to avoid NSAIDs.  No need for nephrology follow-up.  Hyperkalemia, resolved. Related to above. Peak value of 6.8, 4.5 on discharge.  Hypertension, new issue here.  SBP as persistently as high as 170s. Amlodipine and hydralazine initiated with improvement in blood pressure.  Will continue on outpatient will need BP checked by PCP  Sinus tachycardia, greatly improved.  Suspect multifactorial etiology dehydration (vomiting), pain( from kidney stone), side effect of hydralazine.  TSH within normal limits.  Heart rate improved from 140s to low 100s with initiation of metoprolol.  May need to titrate as outpatient.  Consultations:  Nephrology, urology  Procedures/Studies: Urine stent placement on 11/26/18  Discharge Exam: BP (!) 157/95 (BP Location: Right Arm)   Pulse 85   Temp 98.2 F (36.8 C) (Oral)   Resp 20   Wt 133.4 kg   SpO2 100%   BMI 39.89 kg/m    Constitutional:normal appearingmale, no distress Eyes: EOMI, anicteric, normal conjunctivae ENMT: Oropharynx with moist mucous membranes, normal dentition Cardiovascular: tachycardic to 100s, regular rhythmno MRGs, with no peripheral edema Respiratory: Normal respiratory effort on room air, clear breath sounds  Abdomen: Soft,non-tender Skin: No rash ulcers, or lesions. Without skin tenting  Neurologic: Grossly no focal neuro deficit. Psychiatric:Appropriate affect, and mood. Mental status AAOx3   Discharge Instructions You were cared for by a hospitalist during your hospital stay. If you have any questions about your discharge medications or the care  you received while you were in the hospital after you are discharged, you can call the unit and asked to speak with the hospitalist on call if the  hospitalist that took care of you is not available. Once you are discharged, your primary care physician will handle any further medical issues. Please note that NO REFILLS for any discharge medications will be authorized once you are discharged, as it is imperative that you return to your primary care physician (or establish a relationship with a primary care physician if you do not have one) for your aftercare needs so that they can reassess your need for medications and monitor your lab values.  Discharge Instructions    Diet - low sodium heart healthy   Complete by:  As directed    Diet - low sodium heart healthy   Complete by:  As directed    Increase activity slowly   Complete by:  As directed    Increase activity slowly   Complete by:  As directed      Allergies as of 11/27/2017      Reactions   Sitagliptin-metformin Hcl Itching   Janumet      Medication List    TAKE these medications   acetaminophen-codeine 300-30 MG tablet Commonly known as:  TYLENOL #3 Take 1 tablet by mouth every 6 (six) hours as needed for moderate pain (headache).   amLODipine 10 MG tablet Commonly known as:  NORVASC Take 1 tablet (10 mg total) by mouth daily.   aspirin 81 MG tablet Take 81 mg by mouth daily.   Colchicine 0.6 MG Caps Day 1: Take 2 caps, then 1 cap an hour later. Day 2 and beyond: 1 cap daily What changed:    how much to take  how to take this  when to take this  additional instructions   diclofenac 75 MG EC tablet Commonly known as:  VOLTAREN Take 1 tablet (75 mg total) by mouth 2 (two) times daily.   Ginkgo Biloba 40 MG Tabs Take 1 tablet by mouth daily.   hydrALAZINE 50 MG tablet Commonly known as:  APRESOLINE Take 1 tablet (50 mg total) by mouth every 8 (eight) hours.   metFORMIN 750 MG 24 hr tablet Commonly known as:  GLUCOPHAGE-XR TAKE 2 TABLETS BY MOUTH TWICE A DAY What changed:  how much to take   metoprolol tartrate 25 MG tablet Commonly known as:   LOPRESSOR Take 1 tablet (25 mg total) by mouth 2 (two) times daily.   multivitamin tablet Take 1 tablet by mouth daily.   ondansetron 8 MG disintegrating tablet Commonly known as:  ZOFRAN-ODT Take 1 tablet (8 mg total) by mouth every 8 (eight) hours as needed for nausea or vomiting.   saxagliptin HCl 5 MG Tabs tablet Commonly known as:  ONGLYZA Take 1 tablet (5 mg total) by mouth daily.   ST JOHNS WORT PO Take 1 tablet by mouth daily.   tamsulosin 0.4 MG Caps capsule Commonly known as:  FLOMAX Take 1 capsule (0.4 mg total) by mouth daily.   traMADol 50 MG tablet Commonly known as:  ULTRAM Take 1-2 tablets (50-100 mg total) by mouth every 8 (eight) hours as needed.      Allergies  Allergen Reactions  . Sitagliptin-Metformin Hcl Itching    Janumet      The results of significant diagnostics from this hospitalization (including imaging, microbiology, ancillary and laboratory) are listed below for reference.    Significant Diagnostic Studies: Dg C-arm 1-60 Min-no  Report  Result Date: 11/25/2017 Fluoroscopy was utilized by the requesting physician.  No radiographic interpretation.   Ct Renal Stone Study  Result Date: 11/24/2017 CLINICAL DATA:  Left flank pain for the past week. EXAM: CT ABDOMEN AND PELVIS WITHOUT CONTRAST TECHNIQUE: Multidetector CT imaging of the abdomen and pelvis was performed following the standard protocol without IV contrast. COMPARISON:  CT abdomen pelvis dated October 30, 2014. FINDINGS: Lower chest: No acute abnormality. Hepatobiliary: Unchanged diffuse hepatic steatosis. No focal liver abnormality. The gallbladder is unremarkable. No biliary dilatation. Pancreas: Unremarkable. No pancreatic ductal dilatation or surrounding inflammatory changes. Spleen: Normal in size without focal abnormality. Adrenals/Urinary Tract: The adrenal glands are unremarkable. Mild-to-moderate right renal atrophy is unchanged. Multiple right-sided renal calculi measuring  up to 6 mm. Multiple left-sided renal calculi measuring up to 10 mm. 13 mm calculus at the left UPJ with resultant mild left hydronephrosis. Mild circumferential bladder wall thickening may be related to underdistention. Stomach/Bowel: Stomach is within normal limits. Appendix appears normal. No evidence of bowel wall thickening, distention, or inflammatory changes. Vascular/Lymphatic: No significant vascular findings are present. No enlarged abdominal or pelvic lymph nodes. Reproductive: Prostate is unremarkable. Other: No free fluid or pneumoperitoneum. Musculoskeletal: No acute or significant osseous findings. IMPRESSION: 1. 13 mm calculus at the left UPJ with resultant mild left hydronephrosis. 2. Additional bilateral nonobstructive nephrolithiasis measuring up to 10 mm on the left and 6 mm on the right. 3. Unchanged mild to moderate right renal atrophy. 4. Hepatic steatosis. Electronically Signed   By: Titus Dubin M.D.   On: 11/24/2017 14:51    Microbiology: Recent Results (from the past 240 hour(s))  MRSA PCR Screening     Status: None   Collection Time: 11/24/17  8:22 PM  Result Value Ref Range Status   MRSA by PCR NEGATIVE NEGATIVE Final    Comment:        The GeneXpert MRSA Assay (FDA approved for NASAL specimens only), is one component of a comprehensive MRSA colonization surveillance program. It is not intended to diagnose MRSA infection nor to guide or monitor treatment for MRSA infections. Performed at [facility], [place] [address], [place], [place] [address]      Labs: Basic Metabolic Panel: Recent Labs  Lab 11/24/17 1204 11/24/17 1500  11/25/17 0631 11/25/17 1436 11/25/17 2045 11/26/17 0404 11/27/17 0535  NA 138  --   --  138  --  141 140 140  K 7.0*  --    < > 5.7* 6.8* 5.3* 5.2* 4.5  CL 108  --   --  109  --  110 108 109  CO2 20*  --   --  19*  --  21* 23 24  GLUCOSE 170*  --   --  112*  --  91 119* 131*  BUN 62*  --   --  59*  --  38* 32*  22*  CREATININE 8.83*  --   --  8.61*  --  4.24* 3.09* 1.79*  CALCIUM 9.2  --   --  8.4*  --  9.3 8.6* 9.0  MG  --  1.7  --   --   --   --   --   --   PHOS  --  3.2  --   --   --   --   --   --    < > = values in this interval not displayed.   Liver Function Tests: Recent Labs  Lab 11/23/17  1532 11/24/17 1204  AST 12 14*  ALT 11 16  ALKPHOS 59 68  BILITOT 0.3 0.4  PROT 7.5 8.6*  ALBUMIN 4.1 4.4   Recent Labs  Lab 11/24/17 1204  LIPASE 31   Recent Labs  Lab 11/24/17 1500  AMMONIA <9*   CBC: Recent Labs  Lab 11/23/17 1532 11/24/17 1204 11/26/17 0404  WBC 9.0 13.8* 10.2  NEUTROABS  --   --  7.2  HGB 13.2 14.1 13.2  HCT 39.4 43.4 41.7  MCV 86.0 87.1 88.5  PLT 280.0 299 257   Cardiac Enzymes: No results for input(s): CKTOTAL, CKMB, CKMBINDEX, TROPONINI in the last 168 hours. BNP: BNP (last 3 results) No results for input(s): BNP in the last 8760 hours.  ProBNP (last 3 results) No results for input(s): PROBNP in the last 8760 hours.  CBG: Recent Labs  Lab 11/26/17 1153 11/26/17 1616 11/26/17 2115 11/27/17 0759 11/27/17 1146  GLUCAP 200* 124* 144* 133* 176*       Signed:  Desiree Hane, MD [facility] 11/27/2017, 2:35 PM

## 2017-11-28 ENCOUNTER — Telehealth: Payer: Self-pay | Admitting: *Deleted

## 2017-11-28 ENCOUNTER — Other Ambulatory Visit: Payer: Self-pay | Admitting: Urology

## 2017-11-28 LAB — URINE CULTURE: Culture: NO GROWTH

## 2017-11-28 NOTE — Telephone Encounter (Signed)
Per chart review: Admit date: 11/24/2017 Discharge date: 11/27/2017   Time spent: < 25 minutes  Admitted From: home Disposition:  home  Recommendations for Outpatient Follow-up:  1. Follow up with PCP in 1 week 2. Start amlodipine 10 mg daily, hydralazine 50 mg every 8 hours for BP 3. Metoprolol 25 mg twice daily for tachycardia 4. Urology to arrange outpatient follow in 1-2 weeks for stone extraction once kidney function stabilizes 5. Needs repeat BMP on PCP follow up ________________________________________________________________________ Per telephone call: Transition Care Management Follow-up Telephone Call   Date discharged? 11/27/17   How have you been since you were released from the hospital? "good"   Do you understand why you were in the hospital? yes   Do you understand the discharge instructions? yes   Where were you discharged to? Home   Items Reviewed:  Medications reviewed: yes  Allergies reviewed: yes  Dietary changes reviewed: yes  Referrals reviewed: yes   Functional Questionnaire:   Activities of Daily Living (ADLs):   He states they are independent in the following: ambulation, bathing and hygiene, feeding, continence, grooming, toileting and dressing States they require assistance with the following: none   Any transportation issues/concerns?: no   Any patient concerns? no   Confirmed importance and date/time of follow-up visits scheduled yes  Provider Appointment booked with Dr Jerline Pain 11/30/17 8:20  Confirmed with patient if condition begins to worsen call PCP or go to the ER.  Patient was given the office number and encouraged to call back with question or concerns.  : yes

## 2017-11-30 ENCOUNTER — Encounter: Payer: Self-pay | Admitting: Family Medicine

## 2017-11-30 ENCOUNTER — Ambulatory Visit (INDEPENDENT_AMBULATORY_CARE_PROVIDER_SITE_OTHER): Payer: 59 | Admitting: Family Medicine

## 2017-11-30 VITALS — BP 132/82 | HR 101 | Temp 98.5°F | Ht 72.0 in | Wt 289.2 lb

## 2017-11-30 DIAGNOSIS — M109 Gout, unspecified: Secondary | ICD-10-CM | POA: Insufficient documentation

## 2017-11-30 DIAGNOSIS — Z87442 Personal history of urinary calculi: Secondary | ICD-10-CM

## 2017-11-30 DIAGNOSIS — E119 Type 2 diabetes mellitus without complications: Secondary | ICD-10-CM | POA: Diagnosis not present

## 2017-11-30 DIAGNOSIS — N200 Calculus of kidney: Secondary | ICD-10-CM

## 2017-11-30 DIAGNOSIS — R000 Tachycardia, unspecified: Secondary | ICD-10-CM

## 2017-11-30 DIAGNOSIS — I10 Essential (primary) hypertension: Secondary | ICD-10-CM | POA: Diagnosis not present

## 2017-11-30 DIAGNOSIS — N179 Acute kidney failure, unspecified: Secondary | ICD-10-CM

## 2017-11-30 HISTORY — DX: Acute kidney failure, unspecified: N17.9

## 2017-11-30 HISTORY — DX: Personal history of urinary calculi: Z87.442

## 2017-11-30 LAB — POCT URINALYSIS DIPSTICK
Bilirubin, UA: NEGATIVE
Glucose, UA: NEGATIVE
Ketones, UA: NEGATIVE
Nitrite, UA: NEGATIVE
Protein, UA: POSITIVE — AB
Spec Grav, UA: 1.02 (ref 1.010–1.025)
Urobilinogen, UA: 0.2 E.U./dL
pH, UA: 5.5 (ref 5.0–8.0)

## 2017-11-30 LAB — BASIC METABOLIC PANEL
BUN: 34 mg/dL — ABNORMAL HIGH (ref 6–23)
CO2: 19 mEq/L (ref 19–32)
Calcium: 9.5 mg/dL (ref 8.4–10.5)
Chloride: 104 mEq/L (ref 96–112)
Creatinine, Ser: 1.73 mg/dL — ABNORMAL HIGH (ref 0.40–1.50)
GFR: 54.69 mL/min — ABNORMAL LOW (ref >60.00)
Glucose, Bld: 124 mg/dL — ABNORMAL HIGH (ref 70–99)
Potassium: 4.1 mEq/L (ref 3.5–5.1)
Sodium: 135 mEq/L (ref 135–145)

## 2017-11-30 LAB — HEMOGLOBIN A1C: Hgb A1c MFr Bld: 10.2 % — ABNORMAL HIGH (ref 4.6–6.5)

## 2017-11-30 NOTE — Progress Notes (Signed)
Subjective:  Alan Payne is a 47 y.o. male who presents today for a TCM visit.  HPI:  Summary of Hospital admission: Reason for admission: AKI, nephrolithiasis Date of admission: 11/24/2017 Date of discharge: 11/27/2017 Date of Interactive contact: 11/28/2017 Summary of Hospital course: Patient presented to the ED on 11/24/2017 with severe nausea/vomiting and left flank pain.  He was found to have acute renal failure in the setting of nephrolithiasis.  Urology was consulted and a stent was placed.  Patient had significant improvement in his urine output and renal function over the subsequent 2 days.  Patient was also found to be hypertensive and tachycardic while admitted.  He was started on amlodipine, hydralazine, and metoprolol for these issues.  He was discharged home in stable condition.  Nephrolithiasis/AKI His pain is much better.  He has good urine output.  Urine still appears bloody, however it is lighter in color.  No further episodes of nausea.  Will be following up with urology in a couple of weeks to have stone removal.  HTN/tachycardia Patient was started on amlodipine 10mg  daily and hydralazine 50mg  every 8 hours in addition to metoprolol 25mg  twice daily.  He has been compliant with all his medications without obvious side effects.  No reported chest pain or shortness of breath.  Foot/gout Pain is significantly improved since his last visit.  Admits to eating quite a bit of red meat and shellfish lately.  ROS: Per HPI, otherwise a complete review of systems was negative.   PMH:  The following were reviewed and entered/updated in epic: Past Medical History:  Diagnosis Date  . Bilateral ureteral calculi   . Hydronephrosis, right   . OSA on CPAP   . Type 2 diabetes mellitus [facility])    Patient Active Problem List   Diagnosis Date Noted  . Gout 11/30/2017  . Sinus tachycardia 11/27/2017  . HTN (hypertension) 11/26/2017  . Acute renal failure (ARF) ([place])  11/24/2017  . Pain of right midfoot 11/23/2017  . Dysgeusia 11/23/2017  . Morbid obesity ([place]) 03/16/2017  . History of kidney stones 02/19/2016  . OSA (obstructive sleep apnea) 02/19/2016  . Type 2 diabetes mellitus without complication, without long-term current use of insulin ([place]) 02/19/2016  . Left renal stone 09/15/2014   Past Surgical History:  Procedure Laterality Date  . CIRCUMCISION  1993  . CYSTOSCOPY W/ URETERAL STENT PLACEMENT Left 11/25/2017   Procedure: CYSTOSCOPY WITH RETROGRADE PYELOGRAM/URETERAL STENT PLACEMENT;  Surgeon: Cleon Gustin, MD;  Location: [facility];  Service: Urology;  Laterality: Left;  . EXTRACORPOREAL SHOCK WAVE LITHOTRIPSY Left 07-21-2014    Family History  Problem Relation Age of Onset  . Kidney cancer Father   . Hypertension Father   . Cancer Father        kidney  . Diabetes Mother   . Prostate cancer Neg Hx     Medications- Reconciled discharge and current medications in Epic.  Current Outpatient Medications  Medication Sig Dispense Refill  . acetaminophen-codeine (TYLENOL #3) 300-30 MG tablet Take 1 tablet by mouth every 6 (six) hours as needed for moderate pain (headache). 30 tablet 0  . amLODipine (NORVASC) 10 MG tablet Take 1 tablet (10 mg total) by mouth daily. 30 tablet 0  . aspirin 81 MG tablet Take 81 mg by mouth daily.      . Colchicine 0.6 MG CAPS Day 1: Take 2 caps, then 1 cap an hour later. Day 2 and beyond: 1 cap daily (Patient taking differently: Take  0.6 mg by mouth daily. ) 30 capsule 0  . Ginkgo Biloba 40 MG TABS Take 1 tablet by mouth daily.     . hydrALAZINE (APRESOLINE) 50 MG tablet Take 1 tablet (50 mg total) by mouth every 8 (eight) hours. 90 tablet 0  . metFORMIN (GLUCOPHAGE-XR) 750 MG 24 hr tablet TAKE 2 TABLETS BY MOUTH TWICE A DAY 60 tablet 0  . metoprolol tartrate (LOPRESSOR) 25 MG tablet Take 1 tablet (25 mg total) by mouth 2 (two) times daily. 60 tablet 0  . Multiple Vitamin (MULTIVITAMIN) tablet Take 1 tablet  by mouth daily.    . ondansetron (ZOFRAN ODT) 8 MG disintegrating tablet Take 1 tablet (8 mg total) by mouth every 8 (eight) hours as needed for nausea or vomiting. 20 tablet 0  . saxagliptin HCl (ONGLYZA) 5 MG TABS tablet Take 1 tablet (5 mg total) by mouth daily. 90 tablet 0  . tamsulosin (FLOMAX) 0.4 MG CAPS capsule Take 1 capsule (0.4 mg total) by mouth daily. 30 capsule 3  . traMADol (ULTRAM) 50 MG tablet Take 1-2 tablets (50-100 mg total) by mouth every 8 (eight) hours as needed. 30 tablet 0   No current facility-administered medications for this visit.     Allergies-reviewed and updated Allergies  Allergen Reactions  . Sitagliptin-Metformin Hcl Itching    Janumet    Social History   Socioeconomic History  . Marital status: Married    Spouse name: Not on file  . Number of children: 2  . Years of education: Not on file  . Highest education level: Not on file  Occupational History  . Not on file  Social Needs  . Financial resource strain: Not on file  . Food insecurity:    Worry: Not on file    Inability: Not on file  . Transportation needs:    Medical: Not on file    Non-medical: Not on file  Tobacco Use  . Smoking status: Never Smoker  . Smokeless tobacco: Never Used  Substance and Sexual Activity  . Alcohol use: Yes    Alcohol/week: 1.0 standard drinks    Types: 1 Cans of beer per week  . Drug use: No  . Sexual activity: Yes    Partners: Female  Lifestyle  . Physical activity:    Days per week: Not on file    Minutes per session: Not on file  . Stress: Not on file  Relationships  . Social connections:    Talks on phone: Not on file    Gets together: Not on file    Attends religious service: Not on file    Active member of club or organization: Not on file    Attends meetings of clubs or organizations: Not on file    Relationship status: Not on file  Other Topics Concern  . Not on file  Social History Narrative  . Not on file    Objective:    Physical Exam: BP 132/82 (BP Location: Left Arm, Patient Position: Sitting, Cuff Size: Large)   Pulse (!) 101   Temp 98.5 F (36.9 C) (Oral)   Ht 6' (1.829 m)   Wt 289 lb 3.2 oz (131.2 kg)   SpO2 98%   BMI 39.22 kg/m   Gen: NAD, resting comfortably CV: RRR with no murmurs appreciated Pulm: NWOB, CTAB with no crackles, wheezes, or rhonchi GI: Normal bowel sounds present. Soft, Nontender, Nondistended. MSK: No edema, cyanosis, or clubbing noted Skin: Warm, dry Neuro: Grossly normal, moves all extremities  Psych: Normal affect and thought content  Assessment/Plan:  Type 2 diabetes mellitus without complication, without long-term current use of insulin (HCC) Check A1c.  Continue metformin 1500 mg daily, and Onglyza 5 mg daily.  Follow-up with me in 2 to 3 weeks.  Left renal stone Stent appears to be in place and functioning well.  Defer further management to urology.  Acute renal failure (ARF) ([place]) Check BMET today.  Sinus tachycardia Mildly tachycardic today.  We will continue metoprolol 25 mg twice daily for the time being.  Hopefully as he becomes more hydrated and as his nephrolithiasis is resolved, we will be able to come off of his metoprolol.  HTN (hypertension) At goal on amlodipine 10 mg daily, hydralazine 50 mg 3 times daily, and metoprolol tartrate 25 mg twice daily.  He will follow-up with me in 2 to 3 weeks.  Gout Discussed high uric acid level and its likely role in his foot pain and kidney stones.  Discussed importance of low purine diet.  Patient would not like to start allopurinol today.  He will try to manage this via diet alone.  Patient has a moderate level of medical complexity due to number of diagnoses/treatment options and amount/complexity of data reviewed.   Algis Greenhouse. Jerline Pain, MD 11/30/2017 12:28 PM

## 2017-11-30 NOTE — Assessment & Plan Note (Signed)
Discussed high uric acid level and its likely role in his foot pain and kidney stones.  Discussed importance of low purine diet.  Patient would not like to start allopurinol today.  He will try to manage this via diet alone.

## 2017-11-30 NOTE — Assessment & Plan Note (Signed)
Mildly tachycardic today.  We will continue metoprolol 25 mg twice daily for the time being.  Hopefully as he becomes more hydrated and as his nephrolithiasis is resolved, we will be able to come off of his metoprolol.

## 2017-11-30 NOTE — Patient Instructions (Addendum)
It was very nice to see you today!  I am glad that you are feeling better.  Please make sure that you are drinking plenty of fluids. I would like for you to get at least a gallon per day.   Please make sure you are cutting down on foods that worsen gout.   We will check blood work today.  Come back to see me in 2-3 weeks, or sooner as needed.   Take care, Dr Jerline Pain  Low-Purine Diet Purines are compounds that affect the level of uric acid in your body. A low-purine diet is a diet that is low in purines. Eating a low-purine diet can prevent the level of uric acid in your body from getting too high and causing gout or kidney stones or both. What do I need to know about this diet?  Choose low-purine foods. Examples of low-purine foods are listed in the next section.  Drink plenty of fluids, especially water. Fluids can help remove uric acid from your body. Try to drink 8-16 cups (1.9-3.8 L) a day.  Limit foods high in fat, especially saturated fat, as fat makes it harder for the body to get rid of uric acid. Foods high in saturated fat include pizza, cheese, ice cream, whole milk, fried foods, and gravies. Choose foods that are lower in fat and lean sources of protein. Use olive oil when cooking as it contains healthy fats that are not high in saturated fat.  Limit alcohol. Alcohol interferes with the elimination of uric acid from your body. If you are having a gout attack, avoid all alcohol.  Keep in mind that different people's bodies react differently to different foods. You will probably learn over time which foods do or do not affect you. If you discover that a food tends to cause your gout to flare up, avoid eating that food. You can more freely enjoy foods that do not cause problems. If you have any questions about a food item, talk to your dietitian or health care provider. Which foods are low, moderate, and high in purines? The following is a list of foods that are low, moderate,  and high in purines. You can eat any amount of the foods that are low in purines. You may be able to have small amounts of foods that are moderate in purines. Ask your health care provider how much of a food moderate in purines you can have. Avoid foods high in purines. Grains  Foods low in purines: Enriched white bread, pasta, rice, cake, cornbread, popcorn.  Foods moderate in purines: Whole-grain breads and cereals, wheat germ, bran, oatmeal. Uncooked oatmeal. Dry wheat bran or wheat germ.  Foods high in purines: Pancakes, [place] toast, biscuits, muffins. Vegetables  Foods low in purines: All vegetables, except those that are moderate in purines.  Foods moderate in purines: Asparagus, cauliflower, spinach, mushrooms, green peas. Fruits  All fruits are low in purines. Meats and other Protein Foods  Foods low in purines: Eggs, nuts, peanut butter.  Foods moderate in purines: 80-90% lean beef, lamb, veal, pork, poultry, fish, eggs, peanut butter, nuts. Crab, lobster, oysters, and shrimp. Cooked dried beans, peas, and lentils.  Foods high in purines: Anchovies, sardines, herring, mussels, tuna, codfish, scallops, trout, and haddock. Berniece Salines. Organ meats (such as liver or kidney). Tripe. Game meat. Goose. Sweetbreads. Dairy  All dairy foods are low in purines. Low-fat and fat-free dairy products are best because they are low in saturated fat. Beverages  Drinks low in purines:  Water, carbonated beverages, tea, coffee, cocoa.  Drinks moderate in purines: Soft drinks and other drinks sweetened with high-fructose corn syrup. Juices. To find whether a food or drink is sweetened with high-fructose corn syrup, look at the ingredients list.  Drinks high in purines: Alcoholic beverages (such as beer). Condiments  Foods low in purines: Salt, herbs, olives, pickles, relishes, vinegar.  Foods moderate in purines: Butter, margarine, oils, mayonnaise. Fats and Oils  Foods low in purines: All  types, except gravies and sauces made with meat.  Foods high in purines: Gravies and sauces made with meat. Other Foods  Foods low in purines: Sugars, sweets, gelatin. Cake. Soups made without meat.  Foods moderate in purines: Meat-based or fish-based soups, broths, or bouillons. Foods and drinks sweetened with high-fructose corn syrup.  Foods high in purines: High-fat desserts (such as ice cream, cookies, cakes, pies, doughnuts, and chocolate). Contact your dietitian for more information on foods that are not listed here. This information is not intended to replace advice given to you by your health care provider. Make sure you discuss any questions you have with your health care provider. Document Released: 05/21/2010 Document Revised: 07/02/2015 Document Reviewed: 12/31/2012 Elsevier Interactive Patient Education  2017 [employer].

## 2017-11-30 NOTE — Assessment & Plan Note (Signed)
Check BMET today 

## 2017-11-30 NOTE — Assessment & Plan Note (Signed)
Stent appears to be in place and functioning well.  Defer further management to urology.

## 2017-11-30 NOTE — Assessment & Plan Note (Signed)
Check A1c.  Continue metformin 1500 mg daily, and Onglyza 5 mg daily.  Follow-up with me in 2 to 3 weeks.

## 2017-11-30 NOTE — Assessment & Plan Note (Signed)
At goal on amlodipine 10 mg daily, hydralazine 50 mg 3 times daily, and metoprolol tartrate 25 mg twice daily.  He will follow-up with me in 2 to 3 weeks.

## 2017-12-01 ENCOUNTER — Other Ambulatory Visit: Payer: Self-pay

## 2017-12-01 MED ORDER — METFORMIN HCL 1000 MG PO TABS: 1000.0000 mg | ORAL_TABLET | Freq: Two times a day (BID) | ORAL | 3 refills | Status: DC

## 2017-12-01 NOTE — Progress Notes (Signed)
Please inform patient of the following:  His kidney numbers are stable and improving. He should make sure that he is drinking plenty of fluids and avoiding NSAIDs. We can recheck this again in a few weeks.  His blood sugar is very elevated. As we discussed during his office visit, his recent illness could explain some of this, however I would like to increase his metformin to 1000mg  twice daily (please send in a new rx for him for 1000mg  bid). He should continue with his current doses of other medications. He should continue working on cutting down carbs and eating a balanced diet. We can recheck his A1c in 3 months.   Algis Greenhouse. Jerline Pain, MD 12/01/2017 1:19 PM

## 2017-12-06 ENCOUNTER — Encounter (HOSPITAL_BASED_OUTPATIENT_CLINIC_OR_DEPARTMENT_OTHER): Payer: Self-pay

## 2017-12-07 ENCOUNTER — Encounter (HOSPITAL_BASED_OUTPATIENT_CLINIC_OR_DEPARTMENT_OTHER): Payer: Self-pay

## 2017-12-07 ENCOUNTER — Other Ambulatory Visit: Payer: Self-pay

## 2017-12-11 ENCOUNTER — Encounter (HOSPITAL_BASED_OUTPATIENT_CLINIC_OR_DEPARTMENT_OTHER): Payer: Self-pay

## 2017-12-11 ENCOUNTER — Other Ambulatory Visit: Payer: Self-pay

## 2017-12-11 ENCOUNTER — Encounter (HOSPITAL_BASED_OUTPATIENT_CLINIC_OR_DEPARTMENT_OTHER)
Admission: RE | Disposition: A | Discharge status: Home / Self Care | Payer: Self-pay | Source: Ambulatory Visit | Attending: Urology

## 2017-12-11 ENCOUNTER — Ambulatory Visit (HOSPITAL_BASED_OUTPATIENT_CLINIC_OR_DEPARTMENT_OTHER): Payer: 59 | Admitting: Certified Registered"

## 2017-12-11 ENCOUNTER — Ambulatory Visit (HOSPITAL_BASED_OUTPATIENT_CLINIC_OR_DEPARTMENT_OTHER)
Admission: RE | Admit: 2017-12-11 | Discharge: 2017-12-11 | Disposition: A | Discharge status: Home / Self Care | Payer: 59 | Source: Ambulatory Visit | Attending: Urology | Admitting: Urology

## 2017-12-11 ENCOUNTER — Encounter: Payer: Self-pay | Admitting: Family Medicine

## 2017-12-11 DIAGNOSIS — N179 Acute kidney failure, unspecified: Secondary | ICD-10-CM | POA: Insufficient documentation

## 2017-12-11 DIAGNOSIS — G4733 Obstructive sleep apnea (adult) (pediatric): Secondary | ICD-10-CM | POA: Insufficient documentation

## 2017-12-11 DIAGNOSIS — Z79899 Other long term (current) drug therapy: Secondary | ICD-10-CM | POA: Diagnosis not present

## 2017-12-11 DIAGNOSIS — Z9889 Other specified postprocedural states: Secondary | ICD-10-CM | POA: Insufficient documentation

## 2017-12-11 DIAGNOSIS — N201 Calculus of ureter: Secondary | ICD-10-CM | POA: Diagnosis not present

## 2017-12-11 DIAGNOSIS — Z888 Allergy status to other drugs, medicaments and biological substances status: Secondary | ICD-10-CM | POA: Insufficient documentation

## 2017-12-11 DIAGNOSIS — Z8249 Family history of ischemic heart disease and other diseases of the circulatory system: Secondary | ICD-10-CM | POA: Insufficient documentation

## 2017-12-11 DIAGNOSIS — Z87442 Personal history of urinary calculi: Secondary | ICD-10-CM | POA: Diagnosis not present

## 2017-12-11 DIAGNOSIS — E669 Obesity, unspecified: Secondary | ICD-10-CM | POA: Diagnosis not present

## 2017-12-11 DIAGNOSIS — I10 Essential (primary) hypertension: Secondary | ICD-10-CM | POA: Insufficient documentation

## 2017-12-11 DIAGNOSIS — Z8051 Family history of malignant neoplasm of kidney: Secondary | ICD-10-CM | POA: Insufficient documentation

## 2017-12-11 DIAGNOSIS — N132 Hydronephrosis with renal and ureteral calculous obstruction: Secondary | ICD-10-CM | POA: Diagnosis present

## 2017-12-11 DIAGNOSIS — Z7982 Long term (current) use of aspirin: Secondary | ICD-10-CM | POA: Insufficient documentation

## 2017-12-11 DIAGNOSIS — E119 Type 2 diabetes mellitus without complications: Secondary | ICD-10-CM | POA: Insufficient documentation

## 2017-12-11 DIAGNOSIS — Z6838 Body mass index (BMI) 38.0-38.9, adult: Secondary | ICD-10-CM | POA: Insufficient documentation

## 2017-12-11 DIAGNOSIS — Z7984 Long term (current) use of oral hypoglycemic drugs: Secondary | ICD-10-CM | POA: Insufficient documentation

## 2017-12-11 HISTORY — DX: Morbid (severe) obesity due to excess calories: E66.01

## 2017-12-11 HISTORY — DX: Personal history of urinary calculi: Z87.442

## 2017-12-11 HISTORY — DX: Gout, unspecified: M10.9

## 2017-12-11 HISTORY — DX: Presence of spectacles and contact lenses: Z97.3

## 2017-12-11 HISTORY — DX: Unspecified Escherichia coli (E. coli) as the cause of diseases classified elsewhere: B96.20

## 2017-12-11 HISTORY — DX: Constipation, unspecified: K59.00

## 2017-12-11 HISTORY — DX: Tachycardia, unspecified: R00.0

## 2017-12-11 HISTORY — DX: Urinary tract infection, site not specified: N39.0

## 2017-12-11 HISTORY — DX: Heart disease, unspecified: I51.9

## 2017-12-11 HISTORY — PX: HOLMIUM LASER APPLICATION: SHX5852

## 2017-12-11 HISTORY — DX: Hematuria, unspecified: R31.9

## 2017-12-11 HISTORY — PX: CYSTOSCOPY WITH RETROGRADE PYELOGRAM, URETEROSCOPY AND STENT PLACEMENT: SHX5789

## 2017-12-11 HISTORY — DX: Essential (primary) hypertension: I10

## 2017-12-11 HISTORY — DX: Cardiomegaly: I51.7

## 2017-12-11 HISTORY — DX: Acute kidney failure, unspecified: N17.9

## 2017-12-11 LAB — GLUCOSE, CAPILLARY
Glucose-Capillary: 95 mg/dL (ref 70–99)
Glucose-Capillary: 107 mg/dL — ABNORMAL HIGH (ref 70–99)

## 2017-12-11 SURGERY — CYSTOURETEROSCOPY, WITH RETROGRADE PYELOGRAM AND STENT INSERTION
Anesthesia: General | Site: Renal | Laterality: Left

## 2017-12-11 MED ORDER — DEXAMETHASONE SODIUM PHOSPHATE 4 MG/ML IJ SOLN
INTRAMUSCULAR | Status: DC | PRN
  Administered 2017-12-11: 10 mg via INTRAVENOUS

## 2017-12-11 MED ORDER — FENTANYL CITRATE (PF) 100 MCG/2ML IJ SOLN
INTRAMUSCULAR | Status: DC | PRN
  Administered 2017-12-11 (×2): 25 ug via INTRAVENOUS
  Administered 2017-12-11: 50 ug via INTRAVENOUS

## 2017-12-11 MED ORDER — OXYCODONE-ACETAMINOPHEN 5-325 MG PO TABS: 1.0000 | ORAL_TABLET | ORAL | 0 refills | Status: DC | PRN

## 2017-12-11 MED ORDER — CEFTRIAXONE SODIUM 2 G IJ SOLR
INTRAMUSCULAR | Status: AC
  Filled 2017-12-11: qty 20

## 2017-12-11 MED ORDER — SODIUM CHLORIDE 0.9 % IV SOLN
INTRAVENOUS | Status: AC
  Filled 2017-12-11: qty 100

## 2017-12-11 MED ORDER — SULFAMETHOXAZOLE-TRIMETHOPRIM 800-160 MG PO TABS: 1.0000 | ORAL_TABLET | Freq: Two times a day (BID) | ORAL | 0 refills | Status: DC

## 2017-12-11 MED ORDER — KETOROLAC TROMETHAMINE 30 MG/ML IJ SOLN
INTRAMUSCULAR | Status: AC
  Filled 2017-12-11: qty 1

## 2017-12-11 MED ORDER — ACETAMINOPHEN 500 MG PO TABS
1000.0000 mg | ORAL_TABLET | Freq: Once | ORAL | Status: AC
  Administered 2017-12-11: 1000 mg via ORAL
  Filled 2017-12-11: qty 2

## 2017-12-11 MED ORDER — LIDOCAINE 2% (20 MG/ML) 5 ML SYRINGE
INTRAMUSCULAR | Status: AC
  Filled 2017-12-11: qty 5

## 2017-12-11 MED ORDER — MIDAZOLAM HCL 2 MG/2ML IJ SOLN
INTRAMUSCULAR | Status: AC
  Filled 2017-12-11: qty 2

## 2017-12-11 MED ORDER — LIDOCAINE 2% (20 MG/ML) 5 ML SYRINGE
INTRAMUSCULAR | Status: DC | PRN
  Administered 2017-12-11: 100 mg via INTRAVENOUS

## 2017-12-11 MED ORDER — PROPOFOL 10 MG/ML IV BOLUS
INTRAVENOUS | Status: AC
  Filled 2017-12-11: qty 40

## 2017-12-11 MED ORDER — ONDANSETRON HCL 4 MG/2ML IJ SOLN
INTRAMUSCULAR | Status: AC
  Filled 2017-12-11: qty 2

## 2017-12-11 MED ORDER — DEXAMETHASONE SODIUM PHOSPHATE 10 MG/ML IJ SOLN
INTRAMUSCULAR | Status: AC
  Filled 2017-12-11: qty 1

## 2017-12-11 MED ORDER — SODIUM CHLORIDE 0.9 % IR SOLN
Status: DC | PRN
  Administered 2017-12-11: 3000 mL via INTRAVESICAL

## 2017-12-11 MED ORDER — FENTANYL CITRATE (PF) 100 MCG/2ML IJ SOLN
25.0000 ug | INTRAMUSCULAR | Status: DC | PRN
  Filled 2017-12-11: qty 1

## 2017-12-11 MED ORDER — SODIUM CHLORIDE 0.9 % IV SOLN
INTRAVENOUS | Status: DC
  Administered 2017-12-11: 1000 mL via INTRAVENOUS
  Filled 2017-12-11: qty 1000

## 2017-12-11 MED ORDER — ARTIFICIAL TEARS OPHTHALMIC OINT
TOPICAL_OINTMENT | OPHTHALMIC | Status: AC
  Filled 2017-12-11: qty 3.5

## 2017-12-11 MED ORDER — ONDANSETRON HCL 4 MG/2ML IJ SOLN
INTRAMUSCULAR | Status: DC | PRN
  Administered 2017-12-11: 4 mg via INTRAVENOUS

## 2017-12-11 MED ORDER — PROPOFOL 10 MG/ML IV BOLUS
INTRAVENOUS | Status: DC | PRN
  Administered 2017-12-11: 200 mg via INTRAVENOUS

## 2017-12-11 MED ORDER — PROMETHAZINE HCL 25 MG/ML IJ SOLN
6.2500 mg | INTRAMUSCULAR | Status: DC | PRN
  Filled 2017-12-11: qty 1

## 2017-12-11 MED ORDER — MIDAZOLAM HCL 5 MG/5ML IJ SOLN
INTRAMUSCULAR | Status: DC | PRN
  Administered 2017-12-11: 2 mg via INTRAVENOUS

## 2017-12-11 MED ORDER — FENTANYL CITRATE (PF) 100 MCG/2ML IJ SOLN
INTRAMUSCULAR | Status: AC
  Filled 2017-12-11: qty 2

## 2017-12-11 MED ORDER — ACETAMINOPHEN 500 MG PO TABS
ORAL_TABLET | ORAL | Status: AC
  Filled 2017-12-11: qty 2

## 2017-12-11 MED ORDER — IOHEXOL 300 MG/ML  SOLN
INTRAMUSCULAR | Status: DC | PRN
  Administered 2017-12-11: 10 mL via URETHRAL

## 2017-12-11 MED ORDER — SODIUM CHLORIDE 0.9 % IV SOLN
2.0000 g | INTRAVENOUS | Status: AC
  Administered 2017-12-11: 2 g via INTRAVENOUS
  Filled 2017-12-11: qty 20

## 2017-12-11 SURGICAL SUPPLY — 30 items
BAG DRAIN URO-CYSTO SKYTR STRL (DRAIN) ×3 IMPLANT
BAG DRN UROCATH (DRAIN) ×1
BASKET STONE 1.7 NGAGE (UROLOGICAL SUPPLIES) ×2 IMPLANT
CATH INTERMIT  6FR 70CM (CATHETERS) ×2 IMPLANT
CLOTH BEACON ORANGE TIMEOUT ST (SAFETY) ×3 IMPLANT
DRSG TEGADERM 2-3/8X2-3/4 SM (GAUZE/BANDAGES/DRESSINGS) ×2 IMPLANT
EVACUATOR MICROVAS BLADDER (UROLOGICAL SUPPLIES) IMPLANT
EXTRACTOR STONE 1.7FRX115CM (UROLOGICAL SUPPLIES) IMPLANT
FIBER LASER FLEXIVA 1000 (UROLOGICAL SUPPLIES) IMPLANT
FIBER LASER FLEXIVA 200 (UROLOGICAL SUPPLIES) IMPLANT
FIBER LASER FLEXIVA 365 (UROLOGICAL SUPPLIES) IMPLANT
FIBER LASER FLEXIVA 550 (UROLOGICAL SUPPLIES) IMPLANT
FIBER LASER TRAC TIP (UROLOGICAL SUPPLIES) ×2 IMPLANT
GLOVE BIO SURGEON STRL SZ8 (GLOVE) ×3 IMPLANT
GOWN STRL REUS W/TWL XL LVL3 (GOWN DISPOSABLE) ×3 IMPLANT
GUIDEWIRE ANG ZIPWIRE 038X150 (WIRE) ×3 IMPLANT
GUIDEWIRE STR DUAL SENSOR (WIRE) ×2 IMPLANT
INFUSOR MANOMETER BAG 3000ML (MISCELLANEOUS) ×1 IMPLANT
IV NS 1000ML (IV SOLUTION) ×3
IV NS 1000ML BAXH (IV SOLUTION) ×1 IMPLANT
IV NS IRRIG 3000ML ARTHROMATIC (IV SOLUTION) ×3 IMPLANT
KIT TURNOVER CYSTO (KITS) ×3 IMPLANT
MANIFOLD NEPTUNE II (INSTRUMENTS) ×3 IMPLANT
NS IRRIG 500ML POUR BTL (IV SOLUTION) ×3 IMPLANT
PACK CYSTO (CUSTOM PROCEDURE TRAY) ×3 IMPLANT
STENT URET 6FRX26 CONTOUR (STENTS) ×2 IMPLANT
SYR 10ML LL (SYRINGE) ×3 IMPLANT
TUBE CONNECTING 12'X1/4 (SUCTIONS) ×1
TUBE CONNECTING 12X1/4 (SUCTIONS) ×1 IMPLANT
TUBING UROLOGY SET (TUBING) ×2 IMPLANT

## 2017-12-11 NOTE — Anesthesia Preprocedure Evaluation (Signed)
Anesthesia Evaluation  Patient identified by MRN, date of birth, ID band Patient awake    Reviewed: Allergy & Precautions, NPO status , Patient's Chart, lab work & pertinent test results, reviewed documented beta blocker date and time   Airway Mallampati: II  TM Distance: >3 FB Neck ROM: Full    Dental  (+) Teeth Intact, Dental Advisory Given   Pulmonary sleep apnea and Continuous Positive Airway Pressure Ventilation ,    Pulmonary exam normal breath sounds clear to auscultation       Cardiovascular hypertension, Pt. on medications and Pt. on home beta blockers Normal cardiovascular exam Rhythm:Regular Rate:Normal     Neuro/Psych negative neurological ROS     GI/Hepatic negative GI ROS, Neg liver ROS,   Endo/Other  diabetes, Type 2, Oral Hypoglycemic AgentsObesity   Renal/GU Renal disease     Musculoskeletal negative musculoskeletal ROS (+)   Abdominal   Peds  Hematology negative hematology ROS (+)   Anesthesia Other Findings Day of surgery medications reviewed with the patient.  Reproductive/Obstetrics                             Anesthesia Physical Anesthesia Plan  ASA: III  Anesthesia Plan: General   Post-op Pain Management:    Induction: Intravenous  PONV Risk Score and Plan: 3 and Midazolam, Ondansetron and Dexamethasone  Airway Management Planned: LMA  Additional Equipment:   Intra-op Plan:   Post-operative Plan: Extubation in OR  Informed Consent: I have reviewed the patients History and Physical, chart, labs and discussed the procedure including the risks, benefits and alternatives for the proposed anesthesia with the patient or authorized representative who has indicated his/her understanding and acceptance.   Dental advisory given  Plan Discussed with: CRNA  Anesthesia Plan Comments:         Anesthesia Quick Evaluation

## 2017-12-11 NOTE — Op Note (Signed)
.  Preoperative diagnosis: Left ureteral stone  Postoperative diagnosis: Same  Procedure: 1 cystoscopy 2. Left retrograde pyelography 3.  Intraoperative fluoroscopy, under one hour, with interpretation 4.  Left ureteroscopic stone manipulation with laser lithotripsy 5.  Left 6 x 26 JJ stent exchange  Attending: Rosie Fate  Anesthesia: General  Estimated blood loss: None  Drains: Left 6 x 26 JJ ureteral stent with tether  Specimens: stone for analysis  Antibiotics: rocephin  Findings: left proximal ureteral stone. no hydronephrosis. No masses/lesions in the bladder. Ureteral orifices in normal anatomic location.  Indications: Patient is a 47 year old male with a history of left ureteral stone who underwent left stent placement for acute renal failure.  After discussing treatment options, he decided proceed with left ureteroscopic stone manipulation.  Procedure her in detail: The patient was brought to the operating room and a brief timeout was done to ensure correct patient, correct procedure, correct site.  General anesthesia was administered patient was placed in dorsal lithotomy position.  Her genitalia was then prepped and draped in usual sterile fashion.  A rigid 26 French cystoscope was passed in the urethra and the bladder.  Bladder was inspected free masses or lesions.  the ureteral orifices were in the normal orthotopic locations.  Using a grasper the left stent was brought to the ureteral meatus. A zipwire was advanced through the stent and up to the renal pelvis. The stent was then removed. a 6 french ureteral catheter was then instilled into the left ureteral orifice.  a gentle retrograde was obtained and findings noted above. we then removed the cystoscope and cannulated the left ureteral orifice with a semirigid ureteroscope.  We located a stone in the proximal ureter. using a 200 nm laser fiber and fragmented the stone into smaller pieces.  the pieces were then removed  with a Ngage basket.  once all stone fragments were removed we then placed a 6 x 26 double-j ureteral stent over the original zip wire. We then removed the wire and good coil was noted in the the renal pelvis under fluoroscopy and the bladder under direct vision.    the stone fragments were then removed from the bladder and sent for analysis.   the bladder was then drained and this concluded the procedure which was well tolerated by patient.  Complications: None  Condition: Stable, extubated, transferred to PACU  Plan: Patient is to be discharged home as to follow-up in 2 weeks. He is to remove his stent in 72 hours

## 2017-12-11 NOTE — Discharge Instructions (Signed)
Ureteral Stent Implantation, Care After Refer to this sheet in the next few weeks. These instructions provide you with information about caring for yourself after your procedure. Your health care provider may also give you more specific instructions. Your treatment has been planned according to current medical practices, but problems sometimes occur. Call your health care provider if you have any problems or questions after your procedure. What can I expect after the procedure? After the procedure, it is common to have:  Nausea.  Mild pain when you urinate. You may feel this pain in your lower back or lower abdomen. Pain should stop within a few minutes after you urinate. This may last for up to 1 week.  A small amount of blood in your urine for several days.  Follow these instructions at home:  Medicines  Take over-the-counter and prescription medicines only as told by your health care provider.  If you were prescribed an antibiotic medicine, take it as told by your health care provider. Do not stop taking the antibiotic even if you start to feel better.  Do not drive for 24 hours if you received a sedative.  Do not drive or operate heavy machinery while taking prescription pain medicines. Activity  Return to your normal activities as told by your health care provider. Ask your health care provider what activities are safe for you.  Do not lift anything that is heavier than 10 lb (4.5 kg). Follow this limit for 1 week after your procedure, or for as long as told by your health care provider. General instructions  Watch for any blood in your urine. Call your health care provider if the amount of blood in your urine increases.  If you have a catheter: ? Follow instructions from your health care provider about taking care of your catheter and collection bag. ? Do not take baths, swim, or use a hot tub until your health care provider approves.  Drink enough fluid to keep your urine  clear or pale yellow.  Keep all follow-up visits as told by your health care provider. This is important. Contact a health care provider if:  You have pain that gets worse or does not get better with medicine, especially pain when you urinate.  You have difficulty urinating.  You feel nauseous or you vomit repeatedly during a period of more than 2 days after the procedure. Get help right away if:  Your urine is dark red or has blood clots in it.  You are leaking urine (have incontinence).  The end of the stent comes out of your urethra.  You cannot urinate.  You have sudden, sharp, or severe pain in your abdomen or lower back.  You have a fever. This information is not intended to replace advice given to you by your health care provider. Make sure you discuss any questions you have with your health care provider. Document Released: 09/26/2012 Document Revised: 07/02/2015 Document Reviewed: 08/08/2014 Elsevier Interactive Patient Education  2018 [place] [facility] Care Instructions  Activity: Get plenty of rest for the remainder of the day. A responsible individual must stay with you for 24 hours following the procedure.  For the next 24 hours, DO NOT: -Drive a car -Paediatric nurse -Drink alcoholic beverages -Take any medication unless instructed by your physician -Make any legal decisions or sign important papers.  Meals: Start with liquid foods such as gelatin or soup. Progress to regular foods as tolerated. Avoid greasy, spicy, heavy foods. If nausea and/or  vomiting occur, drink only clear liquids until the nausea and/or vomiting subsides. Call your physician if vomiting continues.  Special Instructions/Symptoms: Your throat may feel dry or sore from the anesthesia or the breathing tube placed in your throat during surgery. If this causes discomfort, gargle with warm salt water. The discomfort should disappear within 24 hours.  If you had a  scopolamine patch placed behind your ear for the management of post- operative nausea and/or vomiting:  1. The medication in the patch is effective for 72 hours, after which it should be removed.  Wrap patch in a tissue and discard in the trash. Wash hands thoroughly with soap and water. 2. You may remove the patch earlier than 72 hours if you experience unpleasant side effects which may include dry mouth, dizziness or visual disturbances. 3. Avoid touching the patch. Wash your hands with soap and water after contact with the patch.    PLEASE REMOVE YOUR STENT BY GENTLY PULLING THE TETHER IN 72 HOURS

## 2017-12-11 NOTE — Interval H&P Note (Signed)
History and Physical Interval Note:  12/11/2017 11:10 AM  Alan Payne.  has presented today for surgery, with the diagnosis of LEFT URETERAL STONE  The various methods of treatment have been discussed with the patient and family. After consideration of risks, benefits and other options for treatment, the patient has consented to  Procedure(s) with comments: CYSTOSCOPY WITH RETROGRADE PYELOGRAM, URETEROSCOPY AND STENT PLACEMENT (Left) - 1 HR HOLMIUM LASER APPLICATION (Left) as a surgical intervention .  The patient's history has been reviewed, patient examined, no change in status, stable for surgery.  I have reviewed the patient's chart and labs.  Questions were answered to the patient's satisfaction.     [name]

## 2017-12-11 NOTE — Anesthesia Procedure Notes (Signed)
Procedure Name: LMA Insertion Date/Time: 12/11/2017 11:39 AM Performed by: Catalina Gravel, MD Pre-anesthesia Checklist: Patient identified, Emergency Drugs available, Suction available and Patient being monitored Patient Re-evaluated:Patient Re-evaluated prior to induction Oxygen Delivery Method: Circle system utilized Preoxygenation: Pre-oxygenation with 100% oxygen Induction Type: IV induction Ventilation: Mask ventilation without difficulty LMA: LMA inserted LMA Size: 5.0 Number of attempts: 1 Airway Equipment and Method: Bite block Placement Confirmation: positive ETCO2 Tube secured with: Tape Dental Injury: Teeth and Oropharynx as per pre-operative assessment

## 2017-12-11 NOTE — Anesthesia Postprocedure Evaluation (Signed)
Anesthesia Post Note  Patient: Jet Armbrust.  Procedure(s) Performed: CYSTOSCOPY WITH RETROGRADE PYELOGRAM, URETEROSCOPY AND STENT PLACEMENT (Left Renal) HOLMIUM LASER APPLICATION (Left Renal)     Patient location during evaluation: PACU Anesthesia Type: General Level of consciousness: awake and alert, awake and oriented Pain management: pain level controlled Vital Signs Assessment: post-procedure vital signs reviewed and stable Respiratory status: spontaneous breathing, nonlabored ventilation and respiratory function stable Cardiovascular status: blood pressure returned to baseline and stable Postop Assessment: no apparent nausea or vomiting Anesthetic complications: no    Last Vitals:  Vitals:   12/11/17 1300 12/11/17 1315  BP: 134/89 129/81  Pulse: 79 77  Resp: 16 18  Temp:    SpO2: 98% 97%    Last Pain:  Vitals:   12/11/17 1315  TempSrc:   PainSc: 0-No pain                 Catalina Gravel

## 2017-12-11 NOTE — Transfer of Care (Signed)
   Last Vitals:  Vitals Value Taken Time  BP 144/108 12/11/2017 12:33 PM  Temp    Pulse 92 12/11/2017 12:35 PM  Resp 18 12/11/2017 12:35 PM  SpO2 99 % 12/11/2017 12:35 PM  Vitals shown include unvalidated device data.  Last Pain:  Vitals:   12/11/17 1015  TempSrc:   PainSc: 0-No pain      Patients Stated Pain Goal: 5 (12/11/17 1015)  Immediate Anesthesia Transfer of Care Note  Patient: Alan Payne.  Procedure(s) Performed: Procedure(s) (LRB): CYSTOSCOPY WITH RETROGRADE PYELOGRAM, URETEROSCOPY AND STENT PLACEMENT (Left) HOLMIUM LASER APPLICATION (Left)  Patient Location: PACU  Anesthesia Type: General  Level of Consciousness: awake, alert  and oriented  Airway & Oxygen Therapy: Patient Spontanous Breathing and Patient connected to face mask oxygen  Post-op Assessment: Report given to PACU RN and Post -op Vital signs reviewed and stable  Post vital signs: Reviewed and stable  Complications: No apparent anesthesia complications

## 2017-12-12 ENCOUNTER — Other Ambulatory Visit: Payer: Self-pay | Admitting: *Deleted

## 2017-12-12 ENCOUNTER — Encounter (HOSPITAL_BASED_OUTPATIENT_CLINIC_OR_DEPARTMENT_OTHER): Payer: Self-pay | Admitting: Urology

## 2017-12-12 NOTE — Patient Outreach (Addendum)
[place] [facility]) Care Management  12/12/2017  Alan Payne. Mar 27, 1970 [id number]  Subjective: Telephone call to patient's home  / mobile number, no answer, left HIPAA compliant voicemail message, and requested call back.    Objective: Per KPN (Knowledge Performance Now, point of care tool) and chart review, patient hospitalized 12/11/17 - 12/11/17 for Left ureteral stone, status post cystoscopy, Left retrograde pyelography, Intraoperative fluoroscopy,  Left ureteroscopic stone manipulation with laser lithotripsy, Left 6 x 26 JJ stent exchange on 12/11/17.   Patient hospitalized 11/24/17 -11/27/17 for Acute renal failure (ARF).   Patient also has a history of Sinus tachycardia, hypertension (new issue due 11/24/17 - 11/27/17 hospitalization), OSA (obstructive sleep apnea) with CPAP, diabetes, gout, and Left-sided nephrolithiasis, status post left ureteral stent placement (11/25/2017).      Assessment: Received [facility] Consult/ Transition of care referral on on 12/05/17.  Transition of care follow up pending patient contact.       Plan: RNCM will send unsuccessful outreach  letter, [facility] pamphlet, will call patient for 2nd telephone outreach attempt, transition of care follow up, and proceed with case closure, within 10 business days if no return call.        Darlene H. Annia Friendly, BSN, [place] [facility] Telephonic CM Phone: [phone] Fax: [phone]

## 2017-12-13 ENCOUNTER — Other Ambulatory Visit: Payer: Self-pay | Admitting: *Deleted

## 2017-12-13 NOTE — Patient Outreach (Addendum)
[facility]) Care Management  12/13/2017  Alan Payne. January 01, 1971 [id number]   Subjective: Received voicemail message from [name], states he is returning call, and requested call back.   States can also call his wife Alan Payne at [phone], if unable to reach him. Telephone call to patient's home  / mobile number, no answer, left HIPAA compliant voicemail message, and requested call back.    Objective: Per KPN (Knowledge Performance Now, point of care tool) and chart review, patient hospitalized 12/11/17 - 12/11/17 for Left ureteral stone, status post cystoscopy, Left retrograde pyelography, Intraoperative fluoroscopy,  Left ureteroscopic stone manipulation with laser lithotripsy, Left 6 x 26 JJ stent exchange on 12/11/17.   Patient hospitalized 11/24/17 -11/27/17 for Acute renal failure (ARF).   Patient also has a history of Sinus tachycardia, hypertension (new issue due 11/24/17 - 11/27/17 hospitalization), OSA (obstructive sleep apnea) with CPAP, diabetes, gout, and Left-sided nephrolithiasis, status post left ureteral stent placement (11/25/2017).       Assessment: Received [facility] Consult/ Transition of care referral on on 12/05/17.  Transition of care follow up pending patient contact.       Plan: RNCM has sent unsuccessful outreach  letter, [facility] pamphlet, will call patient for 3rd telephone outreach attempt, transition of care follow up, and proceed with case closure, within 10 business days if no return call.          H. Annia Friendly, BSN, [place] [facility] Telephonic CM Phone: [phone] Fax: [phone]

## 2017-12-14 ENCOUNTER — Other Ambulatory Visit: Payer: Self-pay | Admitting: *Deleted

## 2017-12-14 NOTE — Patient Outreach (Signed)
[facility]) Care Management  12/14/2017  Alan Payne. June 05, 1970 [id number]   Subjective: Telephone call to patient's home / mobile number, spoke with patient, and HIPAA verified.  Discussed [facility] Care Management [facility] Consult  follow up, patient voiced understanding, and is in agreement to follow up.   Patient states he is doing better, trying to pass a stone, request call back when wife is available to complete transition of care follow up.   States his wife deals with his medical things for him, he would like for this RNCM to do the follow up call with he, and his wife on 12/15/17 at 3:30pm.    RNCM voices understanding and states she will call back tomorrow per patient's request.   Patient states he will discuss follow up time with wife when she gets home from work and notify this RNCM if wife will not be available at designated follow up time.     Objective:Per KPN (Knowledge Performance Now, point of care tool) and chart review,patient hospitalized 12/11/17 - 12/11/17 forLeft ureteral stone, status postcystoscopy,Left retrograde pyelography,Intraoperative fluoroscopy,Left ureteroscopic stone manipulation with laser lithotripsy,Left 6 x 26 JJ stent exchangeon 12/11/17. Patient hospitalized 11/24/17 -11/27/17 for Acute renal failure (ARF). Patient also has a history of Sinus tachycardia, hypertension (new issue due 11/24/17 - 11/27/17 hospitalization),OSA (obstructive sleep apnea)with CPAP, diabetes,gout,andLeft-sided nephrolithiasis, status post left ureteral stent placement (11/25/2017).      Assessment: Received [facility] Consult/ Transition of care referral on on 12/05/17.Transition of care follow up pending patient contact.      Plan:RNCM has sent unsuccessful outreach  letter, [facility] pamphlet, will call patient for 4th  telephone outreach attempt per patient's request, transition of care follow up, and proceed with case closure,  within 10 business days if no return call.        Darlene H. Annia Friendly, BSN, [place] [facility] Telephonic CM Phone: [phone] Fax: [phone]

## 2017-12-15 ENCOUNTER — Encounter: Payer: Self-pay | Admitting: *Deleted

## 2017-12-15 ENCOUNTER — Other Ambulatory Visit: Payer: Self-pay | Admitting: Family Medicine

## 2017-12-15 ENCOUNTER — Other Ambulatory Visit: Payer: Self-pay | Admitting: *Deleted

## 2017-12-15 NOTE — Patient Outreach (Addendum)
[facility]) Care Management  12/15/2017  Alan Payne. [birth year] [id number]   Subjective: Received voicemail from Alan Payne, states he is returning call, and requested call back. Telephone call to patient's home / mobile number, spoke with patient, and HIPAA verified.  Discussed [facility] Care Management [facility] Consult  follow up, patient voiced understanding, and is in agreement to follow up.   Patient states he is doing well, wife unable to be on the call this afternoon, he will pass on any information to wife as needed, and he is in agreement to complete follow up with RNCM at this time.   States he removed the stent on 12/14/17 per MD order, has a follow up with surgeon and primary MD on 12/18/17.  States he has been monitoring blood pressure at home, taking blood pressure medications as prescribed, new diagnosed with hypertension, may be related to stones, and hoping pressures will  return to baseline now that stones are resolving.  Patient states he is able to manage self care and has assistance as needed.  Patient voices understanding of medical diagnosis, surgery,  and treatment plan. States he notify this RNCM if assistance needed in the future and is also aware resources can be accessed through [employer].  States wife has family medical leave (FMLA) in place and can take as needed to assist patient.  Discussed  disease management resources through [place] and patient declined referral at this time.  States is working on managing diabetes with exercise and weight loss. Patient states he does not have any education material, transition of care, care coordination, disease management, disease monitoring, transportation, community resource, or pharmacy needs at this time.  States he is very appreciative of the follow up and is in agreement to receive [facility] information.       Objective:Per KPN (Knowledge Performance Now, point of care tool) and  chart review,patient hospitalized 12/11/17 - 12/11/17 forLeft ureteral stone, status postcystoscopy,Left retrograde pyelography,Intraoperative fluoroscopy,Left ureteroscopic stone manipulation with laser lithotripsy,Left 6 x 26 JJ stent exchangeon 12/11/17. Patient hospitalized 11/24/17 -11/27/17 for Acute renal failure (ARF). Patient also has a history of Sinus tachycardia, hypertension (new issue due 11/24/17 - 11/27/17 hospitalization),OSA (obstructive sleep apnea)with CPAP, diabetes,gout,andLeft-sided nephrolithiasis, status post left ureteral stent placement (11/25/2017).      Assessment: Received [facility] Consult/ Transition of care referral on on 12/05/17.Transition of care follow up completed, [facility] Consult completed, no care management needs, and will proceed with case closure.        Plan:RNCM will send patient successful outreach letter, [facility] pamphlet, and magnet. RNCM will complete case closure due to follow up completed / no care management needs.           H. Annia Friendly, BSN, [place] [facility] Telephonic CM Phone: [phone] Fax: [phone]

## 2017-12-18 ENCOUNTER — Encounter: Payer: Self-pay | Admitting: Family Medicine

## 2017-12-18 ENCOUNTER — Ambulatory Visit (INDEPENDENT_AMBULATORY_CARE_PROVIDER_SITE_OTHER): Payer: 59 | Admitting: Family Medicine

## 2017-12-18 VITALS — BP 134/78 | HR 94 | Temp 98.3°F | Ht 72.0 in | Wt 295.0 lb

## 2017-12-18 DIAGNOSIS — I10 Essential (primary) hypertension: Secondary | ICD-10-CM | POA: Diagnosis not present

## 2017-12-18 DIAGNOSIS — E119 Type 2 diabetes mellitus without complications: Secondary | ICD-10-CM

## 2017-12-18 DIAGNOSIS — M109 Gout, unspecified: Secondary | ICD-10-CM | POA: Diagnosis not present

## 2017-12-18 DIAGNOSIS — R000 Tachycardia, unspecified: Secondary | ICD-10-CM

## 2017-12-18 DIAGNOSIS — N200 Calculus of kidney: Secondary | ICD-10-CM

## 2017-12-18 MED ORDER — COLCHICINE 0.6 MG PO CAPS: ORAL_CAPSULE | ORAL | 0 refills | Status: DC

## 2017-12-18 NOTE — Assessment & Plan Note (Signed)
At goal.  We will wean off hydralazine over the next 1 to 2 weeks.  Continue amlodipine 10 mg daily and metoprolol 25 mg twice daily.  Follow-up with me in 3 months.  Discussed home monitoring with goal 140/90 or lower.

## 2017-12-18 NOTE — Assessment & Plan Note (Signed)
Symptoms have drastically improved.  Recommended good oral hydration.  Has urine studies pending via urology.

## 2017-12-18 NOTE — Patient Instructions (Signed)
It was very nice to see you today!  We will work on stopping your hydralazine today.  Please decrease to 25 mg 3 times daily for the next week.  This is half of your current dose.  After that, please decrease to 25 mg twice daily for a week.  You can then stop the medication.  If you have several days in a row of your blood pressure being 140/90 or higher, please let me know.  You can also stop the colchicine today.  I will send in a new prescription for use as needed.  Keep up good work!  Come back to see me in 3 months, or sooner as needed.  Take care, Dr Jerline Pain

## 2017-12-18 NOTE — Progress Notes (Signed)
   Subjective:  Alan Payne. is a 47 y.o. male who presents today with a chief complaint of T2DM.   HPI:  T2DM, chronic problem Seen 2 weeks ago for this.  Had elevated A1c at that time 10 10.2.  Patient's metformin was increased to 1000 mg twice daily.  Tolerating well, however has had a little bit of diarrhea.  He has been trying to work out more and eat healthier diet.  Renal Stone Has seen urology and underwent stone removal.  Not currently having any symptoms.  Currently undergoing urine studies to determine possible etiologies for his kidney stones.  Gout Gout flares essentially resolved.  He is still taking colchicine 0.6 mg once daily.  He has been trying to cut out.  Rich foods.  HTN/Tachycardia  Patient also seen for this about 2 weeks ago.  At that office visit, he was on amlodipine 10 mg daily, hydralazine 50 mg every 8 hours, and metoprolol 25 mg twice daily.   ROS: Per HPI  PMH: He reports that he has never smoked. He has never used smokeless tobacco. He reports that he drinks about 1.0 standard drinks of alcohol per week. He reports that he does not use drugs.  Objective:  Physical Exam: BP 134/78 (BP Location: Left Arm, Patient Position: Sitting, Cuff Size: Large)   Pulse 94   Temp 98.3 F (36.8 C) (Oral)   Ht 6' (1.829 m)   Wt 295 lb (133.8 kg)   SpO2 97%   BMI 40.01 kg/m   Wt Readings from Last 3 Encounters:  12/18/17 295 lb (133.8 kg)  12/11/17 286 lb 12.8 oz (130.1 kg)  11/30/17 289 lb 3.2 oz (131.2 kg)  Gen: NAD, resting comfortably CV: RRR with no murmurs appreciated Pulm: NWOB, CTAB with no crackles, wheezes, or rhonchi  Assessment/Plan:  Type 2 diabetes mellitus without complication, without long-term current use of insulin (HCC) Doing well on metformin 1000 twice daily and Onglyza 5 mg daily.  Encouraged continued lifestyle modifications.  He will follow-up with me in 3 months.  Recheck A1c at that time.  Sinus tachycardia Normal rate  today.  Continue metoprolol 25 mg twice daily.  Hopefully able to wean off soon.  Follow-up with me in 3 months.  Left renal stone Symptoms have drastically improved.  Recommended good oral hydration.  Has urine studies pending via urology.  HTN (hypertension) At goal.  We will wean off hydralazine over the next 1 to 2 weeks.  Continue amlodipine 10 mg daily and metoprolol 25 mg twice daily.  Follow-up with me in 3 months.  Discussed home monitoring with goal 140/90 or lower.  Gout Discussed elevated uric acid level last blood draw.  This is also likely contributing to his recurrent nephrolithiasis.  He does not want to start allopurinol today.  He is going to work on dietary modifications instead.  We will resend colchicine prescription for as needed use.  Algis Greenhouse. Jerline Pain, MD 12/18/2017 3:00 PM

## 2017-12-18 NOTE — Assessment & Plan Note (Signed)
Normal rate today.  Continue metoprolol 25 mg twice daily.  Hopefully able to wean off soon.  Follow-up with me in 3 months.

## 2017-12-18 NOTE — Assessment & Plan Note (Signed)
Discussed elevated uric acid level last blood draw.  This is also likely contributing to his recurrent nephrolithiasis.  He does not want to start allopurinol today.  He is going to work on dietary modifications instead.  We will resend colchicine prescription for as needed use.

## 2017-12-18 NOTE — Assessment & Plan Note (Signed)
Doing well on metformin 1000 twice daily and Onglyza 5 mg daily.  Encouraged continued lifestyle modifications.  He will follow-up with me in 3 months.  Recheck A1c at that time.

## 2017-12-19 ENCOUNTER — Other Ambulatory Visit: Payer: Self-pay

## 2017-12-19 ENCOUNTER — Other Ambulatory Visit: Payer: Self-pay | Admitting: Family Medicine

## 2017-12-19 MED ORDER — AMLODIPINE BESYLATE 10 MG PO TABS: 10.0000 mg | ORAL_TABLET | Freq: Every day | ORAL | 3 refills | Status: DC

## 2017-12-19 MED ORDER — METOPROLOL TARTRATE 25 MG PO TABS: 25.0000 mg | ORAL_TABLET | Freq: Two times a day (BID) | ORAL | 3 refills | Status: DC

## 2017-12-26 ENCOUNTER — Other Ambulatory Visit: Payer: Self-pay

## 2017-12-26 MED ORDER — AMLODIPINE BESYLATE 10 MG PO TABS: 10.0000 mg | ORAL_TABLET | Freq: Every day | ORAL | 3 refills | Status: DC

## 2018-01-29 ENCOUNTER — Other Ambulatory Visit: Payer: Self-pay | Admitting: Family Medicine

## 2018-02-05 ENCOUNTER — Other Ambulatory Visit: Payer: Self-pay | Admitting: Family Medicine

## 2018-02-09 ENCOUNTER — Telehealth: Payer: Self-pay | Admitting: Family Medicine

## 2018-02-09 NOTE — Telephone Encounter (Signed)
Copied from [place] [phone]. Topic: Quick Communication - See Telephone Encounter >> Feb 09, 2018  8:39 AM Blase Mess A wrote: CRM for notification. See Telephone encounter for: 02/09/18.  Patient is requesting an order to be placed for a tetus shot and request his blood type as well please advise [phone]

## 2018-02-09 NOTE — Telephone Encounter (Signed)
See note

## 2018-02-12 ENCOUNTER — Encounter: Payer: Self-pay | Admitting: Family Medicine

## 2018-02-12 NOTE — Telephone Encounter (Signed)
Patient will schedule appt.for TDAP. Also informed patient we can check his blood type and insurance may not cover charges.Patient voices understanding.

## 2018-02-12 NOTE — Telephone Encounter (Signed)
Left voice message for patient to call clinic.  

## 2018-02-16 ENCOUNTER — Ambulatory Visit (INDEPENDENT_AMBULATORY_CARE_PROVIDER_SITE_OTHER): Payer: Self-pay

## 2018-02-16 ENCOUNTER — Encounter: Payer: Self-pay | Admitting: Family Medicine

## 2018-02-16 DIAGNOSIS — Z23 Encounter for immunization: Secondary | ICD-10-CM

## 2018-03-20 ENCOUNTER — Encounter: Payer: Self-pay | Admitting: Family Medicine

## 2018-03-20 ENCOUNTER — Ambulatory Visit: Payer: Managed Care, Other (non HMO) | Admitting: Family Medicine

## 2018-03-20 VITALS — BP 126/74 | HR 70 | Temp 98.1°F | Ht 72.0 in | Wt 299.0 lb

## 2018-03-20 DIAGNOSIS — E119 Type 2 diabetes mellitus without complications: Secondary | ICD-10-CM | POA: Diagnosis not present

## 2018-03-20 DIAGNOSIS — Z87442 Personal history of urinary calculi: Secondary | ICD-10-CM | POA: Diagnosis not present

## 2018-03-20 DIAGNOSIS — R000 Tachycardia, unspecified: Secondary | ICD-10-CM

## 2018-03-20 DIAGNOSIS — I10 Essential (primary) hypertension: Secondary | ICD-10-CM | POA: Diagnosis not present

## 2018-03-20 LAB — POCT GLYCOSYLATED HEMOGLOBIN (HGB A1C): Hemoglobin A1C: 7.3 % — AB (ref 4.0–5.6)

## 2018-03-20 NOTE — Progress Notes (Signed)
   Chief Complaint:  Alan Payne. is a 48 y.o. male who presents today with a chief complaint of T2DM.   Assessment/Plan:  Type 2 diabetes mellitus without complication, without long-term current use of insulin (HCC) A1c improved to 7.3.  Continue metformin 1000 mg twice daily.  Encouraged continued lifestyle modifications.  Follow-up with me in 6 months.  Sinus tachycardia Regular rate and rhythm today.  Will stop metoprolol.  Discussed potential for rebound effects.  Follow-up with me in 3 to 6 months.  HTN (hypertension) Well-controlled today.  Will stop metoprolol.  Discussed potential for rebound effects.  Follow-up in 3 to 6 months, or sooner as needed.  History of kidney stones We will follow-up with urology soon.  May benefit from starting allopurinol as it seems that his stones are uric acid.  Continue Flomax 0.4 mg daily.  Morbid obesity ([place]) Discussed lifestyle modifications.  Patient is hoping to lose 100 pounds by the end of next year.  Discussed importance of healthy diet and regular exercise.  Follow-up in 3 to 6 months.     Subjective:  HPI:  His stable, chronic medical conditions are outlined below:  # T2DM - On metformin 100mg  twice daily and tolerating well - ROS: No polyuria or polydipsia  # Essential Hypertension / Sinus Tachycardia - On metoprolol 25mg  daily and doing well - ROS: No reported chest Payne or shortness of breath.   # History of Kidney stones - On flomax 0.4mg  daily and doing well.  -Has follow-up with urology in a few weeks  ROS: Per HPI  PMH: He reports that he has never smoked. He has never used smokeless tobacco. He reports current alcohol use of about 1.0 standard drinks of alcohol per week. He reports that he does not use drugs.      Objective:  Physical Exam: BP 126/74 (BP Location: Left Arm, Patient Position: Sitting, Cuff Size: Large)   Pulse 70   Temp 98.1 F (36.7 C) (Oral)   Ht 6' (1.829 m)   Wt 299 lb (135.6 kg)    SpO2 96%   BMI 40.55 kg/m   Wt Readings from Last 3 Encounters:  03/20/18 299 lb (135.6 kg)  12/18/17 295 lb (133.8 kg)  12/11/17 286 lb 12.8 oz (130.1 kg)  Gen: NAD, resting comfortably CV: Regular rate and rhythm with no murmurs appreciated Pulm: Normal work of breathing, clear to auscultation bilaterally with no crackles, wheezes, or rhonchi  Results for orders placed or performed in visit on 03/20/18 (from the past 24 hour(s))  POCT glycosylated hemoglobin (Hb A1C)     Status: Abnormal   Collection Time: 03/20/18  8:31 AM  Result Value Ref Range   Hemoglobin A1C 7.3 (A) 4.0 - 5.6 %        Alan M. Jerline Pain, MD 03/20/2018 9:06 AM

## 2018-03-20 NOTE — Assessment & Plan Note (Signed)
A1c improved to 7.3.  Continue metformin 1000 mg twice daily.  Encouraged continued lifestyle modifications.  Follow-up with me in 6 months.

## 2018-03-20 NOTE — Assessment & Plan Note (Signed)
Well-controlled today.  Will stop metoprolol.  Discussed potential for rebound effects.  Follow-up in 3 to 6 months, or sooner as needed.

## 2018-03-20 NOTE — Assessment & Plan Note (Signed)
Regular rate and rhythm today.  Will stop metoprolol.  Discussed potential for rebound effects.  Follow-up with me in 3 to 6 months.

## 2018-03-20 NOTE — Assessment & Plan Note (Signed)
We will follow-up with urology soon.  May benefit from starting allopurinol as it seems that his stones are uric acid.  Continue Flomax 0.4 mg daily.

## 2018-03-20 NOTE — Patient Instructions (Signed)
It was very nice to see you today!  Your sugar looks much better!  It is okay for you to stop the metoprolol.  We will also take onglyza off of the medication list.  Please follow-up with urologist soon.  Keep up the good work!  Come back to see me in 6 months, or sooner as needed.  Take care, Dr Jerline Pain

## 2018-03-20 NOTE — Assessment & Plan Note (Signed)
Discussed lifestyle modifications.  Patient is hoping to lose 100 pounds by the end of next year.  Discussed importance of healthy diet and regular exercise.  Follow-up in 3 to 6 months.

## 2018-03-21 ENCOUNTER — Other Ambulatory Visit: Payer: Self-pay | Admitting: Family Medicine

## 2018-03-23 ENCOUNTER — Encounter: Payer: Self-pay | Admitting: Family Medicine

## 2018-03-23 MED ORDER — TRAMADOL HCL 50 MG PO TABS: ORAL_TABLET | ORAL | 0 refills | Status: DC

## 2018-03-23 NOTE — Telephone Encounter (Signed)
Please advise 

## 2018-08-17 ENCOUNTER — Emergency Department (HOSPITAL_COMMUNITY)
Admission: EM | Admit: 2018-08-17 | Discharge: 2018-08-17 | Disposition: A | Discharge status: Home / Self Care | Payer: Managed Care, Other (non HMO) | Attending: Emergency Medicine | Admitting: Emergency Medicine

## 2018-08-17 ENCOUNTER — Encounter (HOSPITAL_COMMUNITY): Payer: Self-pay | Admitting: Emergency Medicine

## 2018-08-17 ENCOUNTER — Other Ambulatory Visit: Payer: Self-pay

## 2018-08-17 ENCOUNTER — Emergency Department (HOSPITAL_COMMUNITY): Payer: Managed Care, Other (non HMO)

## 2018-08-17 DIAGNOSIS — N23 Unspecified renal colic: Secondary | ICD-10-CM | POA: Diagnosis not present

## 2018-08-17 DIAGNOSIS — Z79899 Other long term (current) drug therapy: Secondary | ICD-10-CM | POA: Insufficient documentation

## 2018-08-17 DIAGNOSIS — N200 Calculus of kidney: Secondary | ICD-10-CM | POA: Diagnosis not present

## 2018-08-17 DIAGNOSIS — Z7984 Long term (current) use of oral hypoglycemic drugs: Secondary | ICD-10-CM | POA: Diagnosis not present

## 2018-08-17 DIAGNOSIS — N2889 Other specified disorders of kidney and ureter: Secondary | ICD-10-CM | POA: Diagnosis not present

## 2018-08-17 DIAGNOSIS — R109 Unspecified abdominal pain: Secondary | ICD-10-CM | POA: Diagnosis present

## 2018-08-17 DIAGNOSIS — I10 Essential (primary) hypertension: Secondary | ICD-10-CM | POA: Diagnosis not present

## 2018-08-17 DIAGNOSIS — E119 Type 2 diabetes mellitus without complications: Secondary | ICD-10-CM | POA: Insufficient documentation

## 2018-08-17 LAB — CBC
HCT: 43.3 % (ref 39.0–52.0)
Hemoglobin: 14.3 g/dL (ref 13.0–17.0)
MCH: 28.5 pg (ref 26.0–34.0)
MCHC: 33 g/dL (ref 30.0–36.0)
MCV: 86.3 fL (ref 80.0–100.0)
Platelets: 235 10*3/uL (ref 150–400)
RBC: 5.02 MIL/uL (ref 4.22–5.81)
RDW: 14.8 % (ref 11.5–15.5)
WBC: 8.9 10*3/uL (ref 4.0–10.5)
nRBC: 0 % (ref 0.0–0.2)

## 2018-08-17 LAB — URINALYSIS, ROUTINE W REFLEX MICROSCOPIC
Bacteria, UA: NONE SEEN
Bilirubin Urine: NEGATIVE
Glucose, UA: >=500 mg/dL — AB
Ketones, ur: NEGATIVE mg/dL
Leukocytes,Ua: NEGATIVE
Nitrite: NEGATIVE
Protein, ur: 100 mg/dL — AB
Specific Gravity, Urine: 1.007 (ref 1.005–1.030)
pH: 6 (ref 5.0–8.0)

## 2018-08-17 LAB — POCT I-STAT EG7
Acid-base deficit: 4 mmol/L — ABNORMAL HIGH (ref 0.0–2.0)
Bicarbonate: 20.2 mmol/L (ref 20.0–28.0)
Calcium, Ion: 1.15 mmol/L (ref 1.15–1.40)
HCT: 42 % (ref 39.0–52.0)
Hemoglobin: 14.3 g/dL (ref 13.0–17.0)
O2 Saturation: 94 %
Potassium: 5 mmol/L (ref 3.5–5.1)
Sodium: 137 mmol/L (ref 135–145)
TCO2: 21 mmol/L — ABNORMAL LOW (ref 22–32)
pCO2, Ven: 34.1 mmHg — ABNORMAL LOW (ref 44.0–60.0)
pH, Ven: 7.38 (ref 7.250–7.430)
pO2, Ven: 72 mmHg — ABNORMAL HIGH (ref 32.0–45.0)

## 2018-08-17 LAB — I-STAT CREATININE, ED: Creatinine, Ser: 1.6 mg/dL — ABNORMAL HIGH (ref 0.61–1.24)

## 2018-08-17 MED ORDER — ONDANSETRON HCL 4 MG/2ML IJ SOLN
4.0000 mg | Freq: Once | INTRAMUSCULAR | Status: AC
  Administered 2018-08-17: 4 mg via INTRAVENOUS
  Filled 2018-08-17: qty 2

## 2018-08-17 MED ORDER — DICLOFENAC SODIUM ER 100 MG PO TB24: 100.0000 mg | ORAL_TABLET | Freq: Every day | ORAL | 0 refills | Status: DC

## 2018-08-17 MED ORDER — FENTANYL CITRATE (PF) 100 MCG/2ML IJ SOLN
100.0000 ug | Freq: Once | INTRAMUSCULAR | Status: AC
  Administered 2018-08-17: 100 ug via INTRAVENOUS
  Filled 2018-08-17: qty 2

## 2018-08-17 MED ORDER — SODIUM CHLORIDE 0.9 % IV BOLUS
500.0000 mL | Freq: Once | INTRAVENOUS | Status: AC
  Administered 2018-08-17: 500 mL via INTRAVENOUS

## 2018-08-17 MED ORDER — ONDANSETRON 8 MG PO TBDP: ORAL_TABLET | ORAL | 0 refills | Status: DC

## 2018-08-17 MED ORDER — KETOROLAC TROMETHAMINE 15 MG/ML IJ SOLN
15.0000 mg | Freq: Once | INTRAMUSCULAR | Status: AC
  Administered 2018-08-17: 15 mg via INTRAVENOUS
  Filled 2018-08-17: qty 1

## 2018-08-17 NOTE — ED Triage Notes (Signed)
Patient with history of kidney stones, states that last time he had this problem he went into kidney failure.  Patient states that he is unable to urinate at this time.  Patient with left flank pain.

## 2018-08-17 NOTE — ED Notes (Signed)
Pt went to c-t 

## 2018-08-17 NOTE — ED Provider Notes (Signed)
[facility]ER[facility] DEPARTMENT Provider Note   CSN: [id number] Arrival date & time: 08/17/18  0258     History   Chief Complaint Chief Complaint  Patient presents with  . Flank Pain    HPI Alan Payne. is a 48 y.o. male.     The history is provided by the patient.  Flank Pain This is a recurrent problem. The current episode started 1 to 2 hours ago. The problem occurs constantly. The problem has not changed since onset.Pertinent negatives include no chest pain, no abdominal pain, no headaches and no shortness of breath. Nothing aggravates the symptoms. Nothing relieves the symptoms. Treatments tried: ultram. The treatment provided no relief.    Past Medical History:  Diagnosis Date  . Acute renal failure (ARF) ([place]) 11/30/2017  . Bilateral ureteral calculi   . Constipation   . E-coli UTI 03/2013  . Gout   . Grade I diastolic dysfunction 24/23/5361   Noted on ECHO  . Hematuria 03/25/2012  . History of kidney stones 11/30/2017   Left renal stone  . Hydronephrosis, right   . Hypertension   . LVH (left ventricular hypertrophy) 11/27/2017   Moderate, noted on ECHO  . Morbid obesity ([place])   . OSA on CPAP   . Sinus tachycardia   . Type 2 diabetes mellitus ([place])   . Wears glasses     Patient Active Problem List   Diagnosis Date Noted  . Gout 11/30/2017  . Sinus tachycardia 11/27/2017  . HTN (hypertension) 11/26/2017  . Morbid obesity ([place]) 03/16/2017  . History of kidney stones 02/19/2016  . OSA (obstructive sleep apnea) 02/19/2016  . Type 2 diabetes mellitus without complication, without long-term current use of insulin ([place]) 02/19/2016    Past Surgical History:  Procedure Laterality Date  . CIRCUMCISION  1993  . CYSTOSCOPY W/ URETERAL STENT PLACEMENT Left 11/25/2017   Procedure: CYSTOSCOPY WITH RETROGRADE PYELOGRAM/URETERAL STENT PLACEMENT;  Surgeon: Cleon Gustin, MD;  Location: [facility];  Service: Urology;  Laterality: Left;   . CYSTOSCOPY WITH RETROGRADE PYELOGRAM, URETEROSCOPY AND STENT PLACEMENT Left 12/11/2017   Procedure: CYSTOSCOPY WITH RETROGRADE PYELOGRAM, URETEROSCOPY AND STENT PLACEMENT;  Surgeon: Cleon Gustin, MD;  Location: [facility];  Service: Urology;  Laterality: Left;  1 HR  . EXTRACORPOREAL SHOCK WAVE LITHOTRIPSY Left 07-21-2014  . HOLMIUM LASER APPLICATION Left 44/04/1538   Procedure: HOLMIUM LASER APPLICATION;  Surgeon: Cleon Gustin, MD;  Location: [facility];  Service: Urology;  Laterality: Left;  Marland Kitchen VASECTOMY          Home Medications    Prior to Admission medications   Medication Sig Start Date End Date Taking? Authorizing Provider  Ginkgo Biloba 40 MG TABS Take 1 tablet by mouth every evening.    Yes [provider]  metFORMIN (GLUCOPHAGE) 1000 MG tablet TAKE 1 TABLET BY MOUTH TWICE A DAY WITH MEALS Patient taking differently: Take 500 mg by mouth 2 (two) times daily with a meal.  03/21/18  Yes Vivi Barrack, MD  Multiple Vitamin (MULTIVITAMIN) tablet Take 1 tablet by mouth daily.   Yes [provider]  tamsulosin (FLOMAX) 0.4 MG CAPS capsule TAKE 1 CAPSULE BY MOUTH EVERY DAY Patient taking differently: Take 0.4 mg by mouth daily as needed (for kidney stone).  01/29/18  Yes Vivi Barrack, MD  traMADol (ULTRAM) 50 MG tablet Take 1-2 tablets every 8 hours as needed for moderate pain Patient taking differently: Take 100 mg by  mouth every 12 (twelve) hours as needed for moderate pain.  03/23/18  Yes Vivi Barrack, MD    Family History Family History  Problem Relation Age of Onset  . Kidney cancer Father   . Hypertension Father   . Cancer Father        kidney  . Diabetes Mother   . Prostate cancer Neg Hx     Social History Social History   Tobacco Use  . Smoking status: Never Smoker  . Smokeless tobacco: Never Used  Substance Use Topics  . Alcohol use: Yes    Alcohol/week: 1.0 standard drinks    Types: 1 Cans  of beer per week    Comment: occ  . Drug use: No     Allergies   Sitagliptin-metformin hcl   Review of Systems Review of Systems  Respiratory: Negative for shortness of breath.   Cardiovascular: Negative for chest pain.  Gastrointestinal: Negative for abdominal pain.  Genitourinary: Positive for flank pain. Negative for dysuria.  Neurological: Negative for headaches.  All other systems reviewed and are negative.    Physical Exam Updated Vital Signs BP (!) 202/109 (BP Location: Right Arm)   Pulse 90   Temp 98.1 F (36.7 C) (Oral)   Resp 16   SpO2 100%   Physical Exam Vitals signs and nursing note reviewed.  Constitutional:      General: He is not in acute distress.    Appearance: He is normal weight.  HENT:     Head: Normocephalic.     Nose: Nose normal.  Eyes:     Conjunctiva/sclera: Conjunctivae normal.     Pupils: Pupils are equal, round, and reactive to light.  Neck:     Musculoskeletal: Normal range of motion and neck supple.  Cardiovascular:     Rate and Rhythm: Normal rate and regular rhythm.     Pulses: Normal pulses.     Heart sounds: Normal heart sounds.  Pulmonary:     Effort: Pulmonary effort is normal.     Breath sounds: Normal breath sounds.  Abdominal:     General: Abdomen is flat. Bowel sounds are normal.     Tenderness: There is no abdominal tenderness. There is no guarding or rebound.  Musculoskeletal: Normal range of motion.  Skin:    General: Skin is warm and dry.     Capillary Refill: Capillary refill takes less than 2 seconds.  Neurological:     General: No focal deficit present.     Mental Status: He is alert and oriented to person, place, and time.  Psychiatric:        Mood and Affect: Mood normal.        Behavior: Behavior normal.      ED Treatments / Results  Labs (all labs ordered are listed, but only abnormal results are displayed) Results for orders placed or performed during the hospital encounter of 08/17/18   Urinalysis, Routine w reflex microscopic- may I&O cath if menses  Result Value Ref Range   Color, Urine STRAW (A) YELLOW   APPearance CLEAR CLEAR   Specific Gravity, Urine 1.007 1.005 - 1.030   pH 6.0 5.0 - 8.0   Glucose, UA >=500 (A) NEGATIVE mg/dL   Hgb urine dipstick SMALL (A) NEGATIVE   Bilirubin Urine NEGATIVE NEGATIVE   Ketones, ur NEGATIVE NEGATIVE mg/dL   Protein, ur 100 (A) NEGATIVE mg/dL   Nitrite NEGATIVE NEGATIVE   Leukocytes,Ua NEGATIVE NEGATIVE   RBC / HPF 6-10 0 - 5 RBC/hpf  WBC, UA 0-5 0 - 5 WBC/hpf   Bacteria, UA NONE SEEN NONE SEEN   Squamous Epithelial / LPF 0-5 0 - 5  CBC  Result Value Ref Range   WBC 8.9 4.0 - 10.5 K/uL   RBC 5.02 4.22 - 5.81 MIL/uL   Hemoglobin 14.3 13.0 - 17.0 g/dL   HCT 43.3 39.0 - 52.0 %   MCV 86.3 80.0 - 100.0 fL   MCH 28.5 26.0 - 34.0 pg   MCHC 33.0 30.0 - 36.0 g/dL   RDW 14.8 11.5 - 15.5 %   Platelets 235 150 - 400 K/uL   nRBC 0.0 0.0 - 0.2 %  POCT I-Stat EG7  Result Value Ref Range   pH, Ven 7.380 7.250 - 7.430   pCO2, Ven 34.1 (L) 44.0 - 60.0 mmHg   pO2, Ven 72.0 (H) 32.0 - 45.0 mmHg   Bicarbonate 20.2 20.0 - 28.0 mmol/L   TCO2 21 (L) 22 - 32 mmol/L   O2 Saturation 94.0 %   Acid-base deficit 4.0 (H) 0.0 - 2.0 mmol/L   Sodium 137 135 - 145 mmol/L   Potassium 5.0 3.5 - 5.1 mmol/L   Calcium, Ion 1.15 1.15 - 1.40 mmol/L   HCT 42.0 39.0 - 52.0 %   Hemoglobin 14.3 13.0 - 17.0 g/dL   Patient temperature HIDE    Sample type VENOUS    Ct Renal Stone Study  Result Date: 08/17/2018 CLINICAL DATA:  Left flank pain. History of kidney stones. EXAM: CT ABDOMEN AND PELVIS WITHOUT CONTRAST TECHNIQUE: Multidetector CT imaging of the abdomen and pelvis was performed following the standard protocol without IV contrast. COMPARISON:  CT scan dated 05/14/2018 FINDINGS: Lower chest: No acute abnormality. Hepatobiliary: No focal liver abnormality is seen. No gallstones, gallbladder wall thickening, or biliary dilatation. Pancreas: Unremarkable.  No pancreatic ductal dilatation or surrounding inflammatory changes. Spleen: Normal in size without focal abnormality. Adrenals/Urinary Tract: There is a 7 mm stone obstructing the distal left ureter just proximal to the bladder creating moderate left hydronephrosis with perinephric soft tissue stranding. There is a conglomeration of stones in the lower pole of the left kidney measuring 2 cm total diameter. There is a 3 mm stone in the lower pole of the left kidney in a separate calyx. There is right renal atrophy. 5 mm stone in the lower pole of the right kidney. No hydronephrosis. Bladder is normal. Adrenal glands are normal. Stomach/Bowel: Stomach is within normal limits. Appendix appears normal. No evidence of bowel wall thickening, distention, or inflammatory changes. Vascular/Lymphatic: No significant vascular findings are present. No enlarged abdominal or pelvic lymph nodes. Reproductive: Prostate is unremarkable. Other: No abdominal wall hernia or abnormality. No abdominopelvic ascites. Musculoskeletal: No acute or significant osseous findings. IMPRESSION: 1. There is a 7 mm stone obstructing the distal left ureter just proximal to the bladder creating moderate left hydronephrosis and perinephric soft tissue stranding. 2. Bilateral nephrolithiasis. 3. Right renal atrophy. Electronically Signed   By: Lorriane Shire M.D.   On: 08/17/2018 04:27    EKG None  Radiology Ct Renal Stone Study  Result Date: 08/17/2018 CLINICAL DATA:  Left flank pain. History of kidney stones. EXAM: CT ABDOMEN AND PELVIS WITHOUT CONTRAST TECHNIQUE: Multidetector CT imaging of the abdomen and pelvis was performed following the standard protocol without IV contrast. COMPARISON:  CT scan dated 05/14/2018 FINDINGS: Lower chest: No acute abnormality. Hepatobiliary: No focal liver abnormality is seen. No gallstones, gallbladder wall thickening, or biliary dilatation. Pancreas: Unremarkable. No pancreatic ductal dilatation or  surrounding inflammatory changes. Spleen: Normal in size without focal abnormality. Adrenals/Urinary Tract: There is a 7 mm stone obstructing the distal left ureter just proximal to the bladder creating moderate left hydronephrosis with perinephric soft tissue stranding. There is a conglomeration of stones in the lower pole of the left kidney measuring 2 cm total diameter. There is a 3 mm stone in the lower pole of the left kidney in a separate calyx. There is right renal atrophy. 5 mm stone in the lower pole of the right kidney. No hydronephrosis. Bladder is normal. Adrenal glands are normal. Stomach/Bowel: Stomach is within normal limits. Appendix appears normal. No evidence of bowel wall thickening, distention, or inflammatory changes. Vascular/Lymphatic: No significant vascular findings are present. No enlarged abdominal or pelvic lymph nodes. Reproductive: Prostate is unremarkable. Other: No abdominal wall hernia or abnormality. No abdominopelvic ascites. Musculoskeletal: No acute or significant osseous findings. IMPRESSION: 1. There is a 7 mm stone obstructing the distal left ureter just proximal to the bladder creating moderate left hydronephrosis and perinephric soft tissue stranding. 2. Bilateral nephrolithiasis. 3. Right renal atrophy. Electronically Signed   By: Lorriane Shire M.D.   On: 08/17/2018 04:27    Procedures Procedures (including critical care time)  Medications Ordered in ED Medications  ketorolac (TORADOL) 15 MG/ML injection 15 mg (15 mg Intravenous Given 08/17/18 0457)  sodium chloride 0.9 % bolus 500 mL (500 mLs Intravenous New Bag/Given 08/17/18 0457)  fentaNYL (SUBLIMAZE) injection 100 mcg (100 mcg Intravenous Given 08/17/18 0457)  ondansetron (ZOFRAN) injection 4 mg (4 mg Intravenous Given 08/17/18 0457)     Has home ultram and flomax  will add voltaren and zofran Final Clinical Impressions(s) / ED Diagnoses   Return for intractable cough, coughing up blood,fevers >100.4  unrelieved by medication, shortness of breath, intractable vomiting, chest pain, shortness of breath, weakness,numbness, changes in speech, facial asymmetry,abdominal pain, passing out,Inability to tolerate liquids or food, cough, altered mental status or any concerns. No signs of systemic illness or infection. The patient is nontoxic-appearing on exam and vital signs are within normal limits.   I have reviewed the triage vital signs and the nursing notes. Pertinent labs &imaging results that were available during my care of the patient were reviewed by me and considered in my medical decision making (see chart for details).  After history, exam, and medical workup I feel the patient has been appropriately medically screened and is safe for discharge home. Pertinent diagnoses were discussed with the patient. Patient was given return precautions     Palumbo, April, MD 08/17/18 [id number]

## 2018-08-17 NOTE — ED Notes (Signed)
Patient verbalizes understanding of discharge instructions. Opportunity for questioning and answers were provided. Armband removed by staff, pt discharged from ED ambulatory.   

## 2018-08-18 LAB — URINE CULTURE: Culture: NO GROWTH

## 2018-09-17 ENCOUNTER — Ambulatory Visit: Payer: Managed Care, Other (non HMO) | Admitting: Family Medicine

## 2018-10-09 ENCOUNTER — Ambulatory Visit (INDEPENDENT_AMBULATORY_CARE_PROVIDER_SITE_OTHER): Payer: Managed Care, Other (non HMO) | Admitting: Family Medicine

## 2018-10-09 ENCOUNTER — Encounter: Payer: Self-pay | Admitting: Family Medicine

## 2018-10-09 VITALS — Wt 289.0 lb

## 2018-10-09 DIAGNOSIS — E119 Type 2 diabetes mellitus without complications: Secondary | ICD-10-CM

## 2018-10-09 DIAGNOSIS — Z87442 Personal history of urinary calculi: Secondary | ICD-10-CM | POA: Diagnosis not present

## 2018-10-09 NOTE — Assessment & Plan Note (Signed)
On metformin 1000 mg twice daily.  Seems to be doing well with this.  Unable to check A1c today due to virtual visit.  He will follow-up in 3 to 6 months for in person visit.  Will check labs at that point.

## 2018-10-09 NOTE — Assessment & Plan Note (Signed)
Patient working hard on lifestyle modifications however is had some limitations due to COVID-19 pandemic.  He is down about 10 pounds over last 6 months.  Encouraged him to keep up the good work.  Will follow.  3 to 6 months.

## 2018-10-09 NOTE — Assessment & Plan Note (Signed)
Patient has past few stone since her last visit.  Overall feels like he is doing well.  He will continue taking Flomax 0.4 mg as needed for kidney stones.

## 2018-10-09 NOTE — Progress Notes (Signed)
    Chief Complaint:  Alan Payne. is a 48 y.o. male who presents today for a virtual office visit with a chief complaint of T2DM.   Assessment/Plan:  Morbid obesity ([place]) Patient working hard on lifestyle modifications however is had some limitations due to COVID-19 pandemic.  He is down about 10 pounds over last 6 months.  Encouraged him to keep up the good work.  Will follow.  3 to 6 months.  Type 2 diabetes mellitus without complication, without long-term current use of insulin ([facility]) On metformin 1000 mg twice daily.  Seems to be doing well with this.  Unable to check A1c today due to virtual visit.  He will follow-up in 3 to 6 months for in person visit.  Will check labs at that point.  History of kidney stones Patient has past few stone since her last visit.  Overall feels like he is doing well.  He will continue taking Flomax 0.4 mg as needed for kidney stones.     Subjective:  HPI:  He was able to stop metoprolol at her last visit.  He is done well off this medication has not had any rebound effects.  His stable, chronic medical conditions are outlined below:  # T2DM - On metformin 100mg  twice daily and tolerating well - ROS: No polyuria or polydipsia  # History of Kidney stones - Follows with urology - On flomax 0.4mg  daily and doing well.   ROS: Per HPI  PMH: He reports that he has never smoked. He has never used smokeless tobacco. He reports current alcohol use of about 1.0 standard drinks of alcohol per week. He reports that he does not use drugs.      Objective/Observations  Physical Exam: Gen: NAD, resting comfortably Pulm: Normal work of breathing Neuro: Grossly normal, moves all extremities Psych: Normal affect and thought content  No results found for this or any previous visit (from the past 24 hour(s)).   Virtual Visit via Video   I connected with [name]. on 10/09/18 at  8:40 AM EDT by a video enabled telemedicine application and  verified that I am speaking with the correct person using two identifiers. I discussed the limitations of evaluation and management by telemedicine and the availability of in person appointments. The patient expressed understanding and agreed to proceed.   Patient location: Home Provider location: [place] participating in the virtual visit: Myself and Patient     Algis Greenhouse. Jerline Pain, MD 10/09/2018 9:12 AM

## 2018-10-11 ENCOUNTER — Other Ambulatory Visit: Payer: Self-pay | Admitting: Family Medicine

## 2018-10-11 ENCOUNTER — Encounter: Payer: Self-pay | Admitting: Family Medicine

## 2018-10-11 ENCOUNTER — Other Ambulatory Visit: Payer: Self-pay

## 2018-10-11 MED ORDER — TRAMADOL HCL 50 MG PO TABS: ORAL_TABLET | ORAL | 0 refills | Status: DC

## 2018-10-11 NOTE — Telephone Encounter (Signed)
Last OV 10/09/18 Last refill 03/23/18 #30/0 Next OV not scheduled  Forwarding to Dr. Jerline Pain

## 2018-11-26 ENCOUNTER — Ambulatory Visit (INDEPENDENT_AMBULATORY_CARE_PROVIDER_SITE_OTHER): Payer: Managed Care, Other (non HMO) | Admitting: Family Medicine

## 2018-11-26 ENCOUNTER — Encounter: Payer: Self-pay | Admitting: Family Medicine

## 2018-11-26 ENCOUNTER — Ambulatory Visit: Payer: Self-pay | Admitting: *Deleted

## 2018-11-26 DIAGNOSIS — R195 Other fecal abnormalities: Secondary | ICD-10-CM

## 2018-11-26 DIAGNOSIS — K625 Hemorrhage of anus and rectum: Secondary | ICD-10-CM | POA: Diagnosis not present

## 2018-11-26 DIAGNOSIS — E119 Type 2 diabetes mellitus without complications: Secondary | ICD-10-CM | POA: Diagnosis not present

## 2018-11-26 NOTE — Telephone Encounter (Signed)
Patient is calling to report he has been having rectal bleeding for a couple days- he did notice clots when wiping this morning. Patient is not sure about amount of bleeding- he states he normally does not look at stool- advised to start so he will know if he is having changes- call to office for appointment.  Reason for Disposition . MODERATE rectal bleeding (small blood clots, passing blood without stool, or toilet water turns red)  Answer Assessment - Initial Assessment Questions 1. APPEARANCE of BLOOD: "What color is it?" "Is it passed separately, on the surface of the stool, or mixed in with the stool?"      Bright red- with clots, not sure- noticed when wipes 2. AMOUNT: "How much blood was passed?"      Unsure- did not look 3. FREQUENCY: "How many times has blood been passed with the stools?"      3-days- not sure if every time 4. ONSET: "When was the blood first seen in the stools?" (Days or weeks)      Couple days 5. DIARRHEA: "Is there also some diarrhea?" If so, ask: "How many diarrhea stools were passed in past 24 hours?"      Yes- 2 6. CONSTIPATION: "Do you have constipation?" If so, "How bad is it?"     no 7. RECURRENT SYMPTOMS: "Have you had blood in your stools before?" If so, ask: "When was the last time?" and "What happened that time?"      Just blood- abrasion 8. BLOOD THINNERS: "Do you take any blood thinners?" (e.g., Coumadin/warfarin, Pradaxa/dabigatran, aspirin)     no 9. OTHER SYMPTOMS: "Do you have any other symptoms?"  (e.g., abdominal pain, vomiting, dizziness, fever)     no 10. PREGNANCY: "Is there any chance you are pregnant?" "When was your last menstrual period?"       n/a  Protocols used: RECTAL BLEEDING-A-AH

## 2018-11-26 NOTE — Progress Notes (Signed)
    Chief Complaint:  Alan Payne. is a 48 y.o. male who presents today for a virtual office visit with a chief complaint of rectal bleeding.   Assessment/Plan:  Rectal bleeding No red flags.  Likely lower GI bleed.  Does not have known history of diverticulosis-probably has bleeding internal hemorrhoids.  Will place referral to GI for further evaluation and management.  Loose Stools Possibly medication side effect.  Recommended the least 30 to 35 g of fiber per day.  Type 2 diabetes mellitus without complication, without long-term current use of insulin ([place]) Patient has cut Metformin to 1000 mg daily.  Will check A1c with next blood draw.    Subjective:  HPI:  Rectal bleeding Started about a month ago.  Having some occasional loose stools as well. Noticed bright red blood with bowel movements. This morning was noticing some blood clots. No pain. He is not taking any ibuprofen or other medications.  No fevers or chills.  No weight loss.  No nausea or vomiting.  He has cut his Metformin to 1000 mg once daily due to loose stools.  Has had some improvement with this.  No recent sick contacts.  No treatments tried.  Not currently taking any NSAIDs.  ROS: Per HPI  PMH: He reports that he has never smoked. He has never used smokeless tobacco. He reports current alcohol use of about 1.0 standard drinks of alcohol per week. He reports that he does not use drugs.      Objective/Observations  Physical Exam: Gen: NAD, resting comfortably Pulm: Normal work of breathing Neuro: Grossly normal, moves all extremities Psych: Normal affect and thought content  No results found for this or any previous visit (from the past 24 hour(s)).   Virtual Visit via Video   I connected with Alan Payne. on 11/26/18 at  4:00 PM EDT by a video enabled telemedicine application and verified that I am speaking with the correct person using two identifiers. I discussed the limitations of  evaluation and management by telemedicine and the availability of in person appointments. The patient expressed understanding and agreed to proceed.   Patient location: Home Provider location: [place] participating in the virtual visit: Myself and Patient     Alan Payne. Jerline Pain, MD 11/26/2018 11:40 AM

## 2018-11-26 NOTE — Telephone Encounter (Signed)
See note

## 2018-11-26 NOTE — Assessment & Plan Note (Signed)
Patient has cut Metformin to 1000 mg daily.  Will check A1c with next blood draw.

## 2018-11-26 NOTE — Telephone Encounter (Signed)
Patient has already been seen. 

## 2018-11-27 ENCOUNTER — Encounter: Payer: Self-pay | Admitting: Physician Assistant

## 2018-12-06 ENCOUNTER — Encounter: Payer: Self-pay | Admitting: Physician Assistant

## 2018-12-06 ENCOUNTER — Ambulatory Visit: Payer: Managed Care, Other (non HMO) | Admitting: Physician Assistant

## 2018-12-06 ENCOUNTER — Other Ambulatory Visit: Payer: Self-pay

## 2018-12-06 VITALS — HR 97 | Temp 99.0°F | Ht 72.0 in | Wt 292.0 lb

## 2018-12-06 DIAGNOSIS — Z1159 Encounter for screening for other viral diseases: Secondary | ICD-10-CM

## 2018-12-06 DIAGNOSIS — K625 Hemorrhage of anus and rectum: Secondary | ICD-10-CM | POA: Diagnosis not present

## 2018-12-06 MED ORDER — NA SULFATE-K SULFATE-MG SULF 17.5-3.13-1.6 GM/177ML PO SOLN: ORAL | 0 refills | Status: DC

## 2018-12-06 NOTE — Patient Instructions (Signed)
Due to recent COVID-19 restrictions implemented by [employer] and state authorities and in an effort to keep both patients and staff as safe as possible, [employer] requires COVID-19 testing prior to any scheduled endoscopic procedure. The testing center is located at [address] [place], [place] [address] in the [facility]  suite.  Your appointment has been scheduled for 01/08/2019 at [place].   Please bring your insurance cards to this appointment. You will require your COVID screen 2 business days prior to your endoscopic procedure.  You are not required to quarantine after your screening.  You will only receive a phone call with the results if it is POSITIVE.  If you do not receive a call the day before your procedure you should begin your prep, if ordered, and you should report to the endo center for your procedure at your designated appointment arrival time ( one hour prior to the procedure time). There is no cost to you for the screening on the day of the swab.  [facility] will file with your insurance company for the testing.    You may receive an automated phone call prior to your procedure or have a message in your MyChart that you have an appointment for a BP/15 at the [facility], please disregard this message.  Your testing will be at the [place] [place] location.   If you are leaving [facility] travel [place] on [address], turn left onto [address], turn night onto [address] at the 1st stop light turn right, pass the [employer] on your right and proceed to [address] (white building).   You have been scheduled for a colonoscopy. Please follow written instructions given to you at your visit today.  Please pick up your prep supplies at the pharmacy within the next 1-3 days. If you use inhalers (even only as  needed), please bring them with you on the day of your procedure.  I appreciate the  opportunity to care for you  Thank You   Amy Shane Crutch

## 2018-12-06 NOTE — Progress Notes (Signed)
Subjective:    Patient ID: Alan Payne., male    DOB: 07/23/1970, 48 y.o.   MRN: [id number]  HPI Alan Payne is a pleasant 48 year old African-American male, new to GI today and referred by Sudie Bailey, MD.  Patient has complaint of occasional small-volume hematochezia generally noted on tissue after bowel movements.  About a week and a half ago he had an episode where he noted 2 small clots on the tissue after a bowel movement.  Interestingly since then he has not seen any further blood.  He had recently been having some diarrhea but now attributes that to drinking a lot of [place]mond milk.  He had just started that and has since stopped and diarrhea resolved. He has no complaints of dark or tarry stools, no rectal pain or discomfort, no complaints of abdominal pain or discomfort. Family history is negative for colon cancer and polyps as far as he is aware, father had kidney cancer and sister had breast cancer. Patient last had hemoglobin checked in July 2020 at which time hemoglobin was 14.5. Patient has history of obstructive sleep apnea, adult onset diabetes mellitus and obesity. No prior GI evaluation.  Review of Systems Pertinent positive and negative review of systems were noted in the above HPI section.  All other review of systems was otherwise negative.  Outpatient Encounter Medications as of 12/06/2018  Medication Sig  . Ginkgo Biloba 40 MG TABS Take 1 tablet by mouth every evening.   . metFORMIN (GLUCOPHAGE) 1000 MG tablet TAKE 1 TABLET BY MOUTH TWICE A DAY WITH MEALS (Patient taking differently: Take 1,000 mg by mouth once. )  . Multiple Vitamin (MULTIVITAMIN) tablet Take 1 tablet by mouth daily.  . tamsulosin (FLOMAX) 0.4 MG CAPS capsule TAKE 1 CAPSULE BY MOUTH EVERY DAY (Patient taking differently: Take 0.4 mg by mouth daily as needed (for kidney stone). )  . traMADol (ULTRAM) 50 MG tablet Take 1-2 tablets every 8 hours as needed for moderate pain  . Na Sulfate-K Sulfate-Mg  Sulf 17.5-3.13-1.6 GM/177ML SOLN 1 kit   No facility-administered encounter medications on file as of 12/06/2018.    Allergies  Allergen Reactions  . Sitagliptin-Metformin Hcl Itching    Janumet   Patient Active Problem List   Diagnosis Date Noted  . Gout 11/30/2017  . Morbid obesity ([place]) 03/16/2017  . History of kidney stones 02/19/2016  . OSA (obstructive sleep apnea) 02/19/2016  . Type 2 diabetes mellitus without complication, without long-term current use of insulin ([place]) 02/19/2016   Social History   Socioeconomic History  . Marital status: Married    Spouse name: Not on file  . Number of children: 2  . Years of education: Not on file  . Highest education level: Not on file  Occupational History  . Not on file  Social Needs  . Financial resource strain: Not on file  . Food insecurity    Worry: Not on file    Inability: Not on file  . Transportation needs    Medical: Not on file    Non-medical: Not on file  Tobacco Use  . Smoking status: Never Smoker  . Smokeless tobacco: Never Used  Substance and Sexual Activity  . Alcohol use: Yes    Alcohol/week: 1.0 standard drinks    Types: 1 Cans of beer per week    Comment: occ  . Drug use: No  . Sexual activity: Yes    Partners: Female    Birth control/protection: Surgical  Lifestyle  .  Physical activity    Days per week: Not on file    Minutes per session: Not on file  . Stress: Not on file  Relationships  . Social Herbalist on phone: Not on file    Gets together: Not on file    Attends religious service: Not on file    Active member of club or organization: Not on file    Attends meetings of clubs or organizations: Not on file    Relationship status: Not on file  . Intimate partner violence    Fear of current or ex partner: Not on file    Emotionally abused: Not on file    Physically abused: Not on file    Forced sexual activity: Not on file  Other Topics Concern  . Not on file  Social  History Narrative  . Not on file    Mr. Shiller family history includes Cancer in his father; Diabetes in his mother; Hypertension in his father; Kidney cancer in his father.      Objective:    Vitals:   12/06/18 0904  Pulse: 97  Temp: 99 F (37.2 C)    Physical Exam Well-developed well-nourished AA male in no acute distress.  Height, Weight,292 BMI 39.4  HEENT; nontraumatic normocephalic, EOMI, PER R LA, sclera anicteric. Oropharynx; not examined/mask/Covid Neck; supple, no JVD Cardiovascular; regular rate and rhythm with S1-S2, no murmur rub or gallop Pulmonary; Clear bilaterally Abdomen; soft, obese, nontender, nondistended, no palpable mass or hepatosplenomegaly, bowel sounds are active Rectal; not done today Skin; benign exam, no jaundice rash or appreciable lesions Extremities; no clubbing cyanosis or edema skin warm and dry Neuro/Psych; alert and oriented x4, grossly nonfocal mood and affect appropriate       Assessment & Plan:   #19 48 year old African-American male with recent intermittent small-volume hematochezia, with bright red blood noted on the tissue with bowel movements.  Rule out occult colon lesion, rule out internal hemorrhoids, rule out proctitis.  #2 obesity #3.  Obstructive sleep apnea #4.  Adult onset diabetes mellitus  Plan; Patient will be scheduled for colonoscopy with Dr. Tarri Glenn.  Procedure was discussed in detail with the patient including indications risks and benefits and he is agreeable to proceed. Further recommendations pending findings at colonoscopy.    Genia Harold PA-C 12/06/2018   Cc: Vivi Barrack, MD

## 2018-12-06 NOTE — Progress Notes (Signed)
Reviewed. I agree with documentation including the assessment and plan.  Kimberly L. Beavers, MD, MPH 

## 2018-12-12 ENCOUNTER — Other Ambulatory Visit: Payer: Self-pay

## 2018-12-12 DIAGNOSIS — Z20822 Contact with and (suspected) exposure to covid-19: Secondary | ICD-10-CM

## 2018-12-13 ENCOUNTER — Encounter: Payer: Self-pay | Admitting: Family Medicine

## 2018-12-13 LAB — NOVEL CORONAVIRUS, NAA: SARS-CoV-2, NAA: NOT DETECTED

## 2018-12-26 ENCOUNTER — Other Ambulatory Visit: Payer: Self-pay

## 2018-12-26 ENCOUNTER — Encounter: Payer: Self-pay | Admitting: Gastroenterology

## 2018-12-27 ENCOUNTER — Ambulatory Visit (INDEPENDENT_AMBULATORY_CARE_PROVIDER_SITE_OTHER): Payer: Managed Care, Other (non HMO) | Admitting: Family Medicine

## 2018-12-27 ENCOUNTER — Encounter: Payer: Self-pay | Admitting: Family Medicine

## 2018-12-27 VITALS — BP 152/94 | HR 70 | Temp 98.0°F | Ht 72.0 in | Wt 294.0 lb

## 2018-12-27 DIAGNOSIS — Z6839 Body mass index (BMI) 39.0-39.9, adult: Secondary | ICD-10-CM

## 2018-12-27 DIAGNOSIS — R200 Anesthesia of skin: Secondary | ICD-10-CM | POA: Diagnosis not present

## 2018-12-27 DIAGNOSIS — Z87442 Personal history of urinary calculi: Secondary | ICD-10-CM

## 2018-12-27 DIAGNOSIS — Z13220 Encounter for screening for lipoid disorders: Secondary | ICD-10-CM | POA: Diagnosis not present

## 2018-12-27 DIAGNOSIS — Z125 Encounter for screening for malignant neoplasm of prostate: Secondary | ICD-10-CM | POA: Diagnosis not present

## 2018-12-27 DIAGNOSIS — E119 Type 2 diabetes mellitus without complications: Secondary | ICD-10-CM

## 2018-12-27 DIAGNOSIS — Z0001 Encounter for general adult medical examination with abnormal findings: Secondary | ICD-10-CM | POA: Diagnosis not present

## 2018-12-27 DIAGNOSIS — R030 Elevated blood-pressure reading, without diagnosis of hypertension: Secondary | ICD-10-CM

## 2018-12-27 DIAGNOSIS — E669 Obesity, unspecified: Secondary | ICD-10-CM

## 2018-12-27 LAB — CBC
HCT: 42.4 % (ref 39.0–52.0)
Hemoglobin: 14.7 g/dL (ref 13.0–17.0)
MCHC: 34.8 g/dL (ref 30.0–36.0)
MCV: 85.7 fl (ref 78.0–100.0)
Platelets: 270 10*3/uL (ref 150.0–400.0)
RBC: 4.94 Mil/uL (ref 4.22–5.81)
RDW: 14.4 % (ref 11.5–15.5)
WBC: 7.3 10*3/uL (ref 4.0–10.5)

## 2018-12-27 LAB — MICROALBUMIN / CREATININE URINE RATIO
Creatinine,U: 161.6 mg/dL
Microalb Creat Ratio: 104.1 mg/g — ABNORMAL HIGH (ref 0.0–30.0)
Microalb, Ur: 168.2 mg/dL — ABNORMAL HIGH (ref 0.0–1.9)

## 2018-12-27 LAB — COMPREHENSIVE METABOLIC PANEL
ALT: 15 U/L (ref 0–53)
AST: 14 U/L (ref 0–37)
Albumin: 4.1 g/dL (ref 3.5–5.2)
Alkaline Phosphatase: 80 U/L (ref 39–117)
BUN: 12 mg/dL (ref 6–23)
CO2: 22 mEq/L (ref 19–32)
Calcium: 9.2 mg/dL (ref 8.4–10.5)
Chloride: 101 mEq/L (ref 96–112)
Creatinine, Ser: 1.14 mg/dL (ref 0.40–1.50)
GFR: 82.88 mL/min (ref >60.00)
Glucose, Bld: 243 mg/dL — ABNORMAL HIGH (ref 70–99)
Potassium: 4.1 mEq/L (ref 3.5–5.1)
Sodium: 134 mEq/L — ABNORMAL LOW (ref 135–145)
Total Bilirubin: 0.4 mg/dL (ref 0.2–1.2)
Total Protein: 6.8 g/dL (ref 6.0–8.3)

## 2018-12-27 LAB — PSA: PSA: 0.4 ng/mL (ref 0.10–4.00)

## 2018-12-27 LAB — LDL CHOLESTEROL, DIRECT: Direct LDL: 43 mg/dL

## 2018-12-27 LAB — TSH: TSH: 1.34 u[IU]/mL (ref 0.35–4.50)

## 2018-12-27 LAB — VITAMIN B12: Vitamin B-12: 177 pg/mL — ABNORMAL LOW (ref 211–911)

## 2018-12-27 LAB — LIPID PANEL
Cholesterol: 235 mg/dL — ABNORMAL HIGH (ref 0–200)
HDL: 19.8 mg/dL — ABNORMAL LOW (ref >39.00)
Total CHOL/HDL Ratio: 12
Triglycerides: 1456 mg/dL — ABNORMAL HIGH (ref 0.0–149.0)

## 2018-12-27 LAB — HEMOGLOBIN A1C: Hgb A1c MFr Bld: 12.3 % — ABNORMAL HIGH (ref 4.6–6.5)

## 2018-12-27 NOTE — Assessment & Plan Note (Signed)
Continue flomax 0.4mg daily.  ?

## 2018-12-27 NOTE — Progress Notes (Signed)
Chief Complaint:  Alan Payne. is a 48 y.o. male who presents today for his annual comprehensive physical exam.    Assessment/Plan:  Type 2 diabetes mellitus without complication, without long-term current use of insulin ([place]) Reported sugars into the 200s and 300s.  Will continue Metformin 1000 mg twice daily.  Check A1c.  History of kidney stones Continue flomax 0.77m daily.   Elevated BP Reading Slightly above goal today.  Fluids is been well controlled.  Will continue home monitoring with goal 140/90 or lower.  Foot numbness Likely due to loss of transverse arch and callus buildup.  Monofilament grossly intact today.  Will check labs including CBC, C met, TSH, and B12.  Recommended use of metatarsal pads.  Body mass index is 39.87 kg/m. / Obese BMI Metric Follow Up - 12/27/18 0900      BMI Metric Follow Up-Please document annually   BMI Metric Follow Up  Education provided       Preventative Healthcare: Check CBC, C met, TSH, PSA, lipid panel.  Up-to-date on other vaccines and screenings.  Check urine microalbumin.   Patient Counseling(The following topics were reviewed and/or handout was given):  -Nutrition: Stressed importance of moderation in sodium/caffeine intake, saturated fat and cholesterol, caloric balance, sufficient intake of fresh fruits, vegetables, and fiber.  -Stressed the importance of regular exercise.   -Substance Abuse: Discussed cessation/primary prevention of tobacco, alcohol, or other drug use; driving or other dangerous activities under the influence; availability of treatment for abuse.   -Injury prevention: Discussed safety belts, safety helmets, smoke detector, smoking near bedding or upholstery.   -Sexuality: Discussed sexually transmitted diseases, partner selection, use of condoms, avoidance of unintended pregnancy and contraceptive alternatives.   -Dental health: Discussed importance of regular tooth brushing, flossing, and dental visits.   -Health maintenance and immunizations reviewed. Please refer to Health maintenance section.  Return to care in 1 year for next preventative visit.     Subjective:  HPI:  He has no acute complaints today.   He has been having some numbness in bilateral feet. Worsening last few weeks. Located on fifth digit.  He also just recently started back on metformin 10023mtwice daily.  He has been doing once per day.  Noticed his sugars were into the 300s and 200s.  He has been on twice daily dosing for the last couple of days.  His stable, chronic medical conditions are outlined below:   # T2DM - On metformin 100063mwice daily and tolerating well - ROS: No polyuria or polydipsia  # History of Kidney stones - Follows with urology - On flomax 0.4mg34mily and doing well.   Lifestyle Diet: No specific diets or eating plans.  Exercise: No specific exercises.   Depression screen PHQ 2/9 12/27/2018  Decreased Interest 0  Down, Depressed, Hopeless 0  PHQ - 2 Score 0    Health Maintenance Due  Topic Date Due  . URINE MICROALBUMIN  08/18/2017  . OPHTHALMOLOGY EXAM  01/21/2018  . FOOT EXAM  06/13/2018  . HEMOGLOBIN A1C  09/18/2018     ROS: Per HPI, otherwise a complete review of systems was negative.   PMH:  The following were reviewed and entered/updated in epic: Past Medical History:  Diagnosis Date  . Acute renal failure (ARF) (HCC)[place]/24/2019  . Bilateral ureteral calculi   . Constipation   . E-coli UTI 03/2013  . Gout   . Grade I diastolic dysfunction 10/298/33/8250oted on ECHO  .  Hematuria 03/25/2012  . History of kidney stones 11/30/2017   Left renal stone  . Hydronephrosis, right   . Hypertension   . LVH (left ventricular hypertrophy) 11/27/2017   Moderate, noted on ECHO  . Morbid obesity ([place])   . OSA on CPAP   . Sinus tachycardia   . Type 2 diabetes mellitus ([place])   . Wears glasses    Patient Active Problem List   Diagnosis Date Noted  . Gout 11/30/2017   . History of kidney stones 02/19/2016  . OSA (obstructive sleep apnea) 02/19/2016  . Type 2 diabetes mellitus without complication, without long-term current use of insulin ([place]) 02/19/2016   Past Surgical History:  Procedure Laterality Date  . CIRCUMCISION  1993  . CYSTOSCOPY W/ URETERAL STENT PLACEMENT Left 11/25/2017   Procedure: CYSTOSCOPY WITH RETROGRADE PYELOGRAM/URETERAL STENT PLACEMENT;  Surgeon: Cleon Gustin, MD;  Location: [facility];  Service: Urology;  Laterality: Left;  . CYSTOSCOPY WITH RETROGRADE PYELOGRAM, URETEROSCOPY AND STENT PLACEMENT Left 12/11/2017   Procedure: CYSTOSCOPY WITH RETROGRADE PYELOGRAM, URETEROSCOPY AND STENT PLACEMENT;  Surgeon: Cleon Gustin, MD;  Location: [facility];  Service: Urology;  Laterality: Left;  1 HR  . EXTRACORPOREAL SHOCK WAVE LITHOTRIPSY Left 07-21-2014  . HOLMIUM LASER APPLICATION Left 70/03/6376   Procedure: HOLMIUM LASER APPLICATION;  Surgeon: Cleon Gustin, MD;  Location: [facility];  Service: Urology;  Laterality: Left;  Marland Kitchen VASECTOMY      Family History  Problem Relation Age of Onset  . Kidney cancer Father   . Hypertension Father   . Cancer Father        kidney  . Diabetes Mother   . Prostate cancer Neg Hx     Medications- reviewed and updated Current Outpatient Medications  Medication Sig Dispense Refill  . Ginkgo Biloba 40 MG TABS Take 1 tablet by mouth every evening.     . metFORMIN (GLUCOPHAGE) 1000 MG tablet TAKE 1 TABLET BY MOUTH TWICE A DAY WITH MEALS (Patient taking differently: Take 1,000 mg by mouth 2 (two) times daily with a meal. ) 180 tablet 3  . Multiple Vitamin (MULTIVITAMIN) tablet Take 1 tablet by mouth daily.    . tamsulosin (FLOMAX) 0.4 MG CAPS capsule TAKE 1 CAPSULE BY MOUTH EVERY DAY (Patient taking differently: Take 0.4 mg by mouth daily as needed (for kidney stone). ) 90 capsule 1  . traMADol (ULTRAM) 50 MG tablet Take 1-2 tablets every 8 hours as needed for  moderate pain 30 tablet 0   No current facility-administered medications for this visit.     Allergies-reviewed and updated Allergies  Allergen Reactions  . Sitagliptin-Metformin Hcl Itching    Janumet    Social History   Socioeconomic History  . Marital status: Married    Spouse name: Not on file  . Number of children: 2  . Years of education: Not on file  . Highest education level: Not on file  Occupational History  . Not on file  Social Needs  . Financial resource strain: Not on file  . Food insecurity    Worry: Not on file    Inability: Not on file  . Transportation needs    Medical: Not on file    Non-medical: Not on file  Tobacco Use  . Smoking status: Never Smoker  . Smokeless tobacco: Never Used  Substance and Sexual Activity  . Alcohol use: Yes    Alcohol/week: 1.0 standard drinks    Types: 1 Cans of beer per  week    Comment: occ  . Drug use: No  . Sexual activity: Yes    Partners: Female    Birth control/protection: Surgical  Lifestyle  . Physical activity    Days per week: Not on file    Minutes per session: Not on file  . Stress: Not on file  Relationships  . Social Herbalist on phone: Not on file    Gets together: Not on file    Attends religious service: Not on file    Active member of club or organization: Not on file    Attends meetings of clubs or organizations: Not on file    Relationship status: Not on file  Other Topics Concern  . Not on file  Social History Narrative  . Not on file        Objective:  Physical Exam: BP (!) 152/94   Pulse 70   Temp 98 F (36.7 C)   Ht 6' (1.829 m)   Wt 294 lb (133.4 kg)   SpO2 98%   BMI 39.87 kg/m   Body mass index is 39.87 kg/m. Wt Readings from Last 3 Encounters:  12/27/18 294 lb (133.4 kg)  12/06/18 292 lb (132.5 kg)  10/09/18 289 lb (131.1 kg)   Gen: NAD, resting comfortably HEENT: TMs normal bilaterally. OP clear. No thyromegaly noted.  CV: RRR with no murmurs  appreciated Pulm: NWOB, CTAB with no crackles, wheezes, or rhonchi GI: Normal bowel sounds present. Soft, Nontender, Nondistended. MSK: no edema, cyanosis, or clubbing noted.  Monofilament testing intact bilaterally.  Also transverse arch noted in bilateral feet with hammertoe deformity bilaterally.  Significant callus buildup. Skin: warm, dry Neuro: CN2-12 grossly intact. Strength 5/5 in upper and lower extremities. Reflexes symmetric and intact bilaterally.  Psych: Normal affect and thought content      M. Jerline Pain, MD 12/27/2018 9:03 AM

## 2018-12-27 NOTE — Assessment & Plan Note (Signed)
Reported sugars into the 200s and 300s.  Will continue Metformin 1000 mg twice daily.  Check A1c.

## 2018-12-27 NOTE — Progress Notes (Signed)
Please inform patient of the following:  His A1c is very high. Recommend he continue metformin 2000mg  twice daily and really focus on diet and exercise. Recommend starting jardiance 10mg  daily to lower blood sugar and help with weight loss. Please send in for patient.   His triglycerides are very high. Recommend starting lipitor 40mg  daily to improve numbers and lower risk of heart attack and stroke.   His B12 is low - this could explain some of his numbness. Recommend starting B12 protocol.   He has a moderate amount of protien in his urine. This is probably due to his diabetes. Recommend starting losartan 25mg  daily to help protect kidneys.  If has further questions recommend OV to go over labs in detail and next steps.  Would like to see him back in 3 months to recheck A1c.  Alan Payne. Jerline Pain, MD 12/27/2018 2:16 PM

## 2018-12-27 NOTE — Patient Instructions (Signed)
It was very nice to see you today!  We will check blood work today.  Please use the metatarsal pads for your feet.  Please keep an eye on your blood pressures let me know if persistently 140/90 or higher.  I would like to see you back in 3 to 6 months depending on your blood work.  Take care, Dr Jerline Pain  Please try these tips to maintain a healthy lifestyle:   Eat at least 3 REAL meals and 1-2 snacks per day.  Aim for no more than 5 hours between eating.  If you eat breakfast, please do so within one hour of getting up.    Obtain twice as many fruits/vegetables as protein or carbohydrate foods for both lunch and dinner. (Half of each meal should be fruits/vegetables, one quarter protein, and one quarter starchy carbs)   Cut down on sweet beverages. This includes juice, soda, and sweet tea.    Exercise at least 150 minutes every week.    Preventive Care 48-48 Years Old, Male Preventive care refers to lifestyle choices and visits with your health care provider that can promote health and wellness. This includes:  A yearly physical exam. This is also called an annual well check.  Regular dental and eye exams.  Immunizations.  Screening for certain conditions.  Healthy lifestyle choices, such as eating a healthy diet, getting regular exercise, not using drugs or products that contain nicotine and tobacco, and limiting alcohol use. What can I expect for my preventive care visit? Physical exam Your health care provider will check:  Height and weight. These may be used to calculate body mass index (BMI), which is a measurement that tells if you are at a healthy weight.  Heart rate and blood pressure.  Your skin for abnormal spots. Counseling Your health care provider may ask you questions about:  Alcohol, tobacco, and drug use.  Emotional well-being.  Home and relationship well-being.  Sexual activity.  Eating habits.  Work and work Statistician. What  immunizations do I need?  Influenza (flu) vaccine  This is recommended every year. Tetanus, diphtheria, and pertussis (Tdap) vaccine  You may need a Td booster every 10 years. Varicella (chickenpox) vaccine  You may need this vaccine if you have not already been vaccinated. Zoster (shingles) vaccine  You may need this after age 76. Measles, mumps, and rubella (MMR) vaccine  You may need at least one dose of MMR if you were born in 1957 or later. You may also need a second dose. Pneumococcal conjugate (PCV13) vaccine  You may need this if you have certain conditions and were not previously vaccinated. Pneumococcal polysaccharide (PPSV23) vaccine  You may need one or two doses if you smoke cigarettes or if you have certain conditions. Meningococcal conjugate (MenACWY) vaccine  You may need this if you have certain conditions. Hepatitis A vaccine  You may need this if you have certain conditions or if you travel or work in places where you may be exposed to hepatitis A. Hepatitis B vaccine  You may need this if you have certain conditions or if you travel or work in places where you may be exposed to hepatitis B. Haemophilus influenzae type b (Hib) vaccine  You may need this if you have certain risk factors. Human papillomavirus (HPV) vaccine  If recommended by your health care provider, you may need three doses over 6 months. You may receive vaccines as individual doses or as more than one vaccine together in one shot (combination  vaccines). Talk with your health care provider about the risks and benefits of combination vaccines. What tests do I need? Blood tests  Lipid and cholesterol levels. These may be checked every 5 years, or more frequently if you are over 72 years old.  Hepatitis C test.  Hepatitis B test. Screening  Lung cancer screening. You may have this screening every year starting at age 53 if you have a 30-pack-year history of smoking and currently smoke  or have quit within the past 15 years.  Prostate cancer screening. Recommendations will vary depending on your family history and other risks.  Colorectal cancer screening. All adults should have this screening starting at age 48 and continuing until age 66. Your health care provider may recommend screening at age 61 if you are at increased risk. You will have tests every 1-10 years, depending on your results and the type of screening test.  Diabetes screening. This is done by checking your blood sugar (glucose) after you have not eaten for a while (fasting). You may have this done every 1-3 years.  Sexually transmitted disease (STD) testing. Follow these instructions at home: Eating and drinking  Eat a diet that includes fresh fruits and vegetables, whole grains, lean protein, and low-fat dairy products.  Take vitamin and mineral supplements as recommended by your health care provider.  Do not drink alcohol if your health care provider tells you not to drink.  If you drink alcohol: ? Limit how much you have to 0-2 drinks a day. ? Be aware of how much alcohol is in your drink. In the [place] one drink equals one 12 oz bottle of beer (355 mL), one 5 oz glass of wine (148 mL), or one 1 oz glass of hard liquor (44 mL). Lifestyle  Take daily care of your teeth and gums.  Stay active. Exercise for at least 30 minutes on 5 or more days each week.  Do not use any products that contain nicotine or tobacco, such as cigarettes, e-cigarettes, and chewing tobacco. If you need help quitting, ask your health care provider.  If you are sexually active, practice safe sex. Use a condom or other form of protection to prevent STIs (sexually transmitted infections).  Talk with your health care provider about taking a low-dose aspirin every day starting at age 64. What's next?  Go to your health care provider once a year for a well check visit.  Ask your health care provider how often you should have  your eyes and teeth checked.  Stay up to date on all vaccines. This information is not intended to replace advice given to you by your health care provider. Make sure you discuss any questions you have with your health care provider. Document Released: 02/20/2015 Document Revised: 01/18/2018 Document Reviewed: 01/18/2018 Elsevier Patient Education  2020 [employer].

## 2018-12-28 ENCOUNTER — Other Ambulatory Visit: Payer: Self-pay | Admitting: Gastroenterology

## 2018-12-31 ENCOUNTER — Other Ambulatory Visit: Payer: Self-pay

## 2018-12-31 LAB — SARS CORONAVIRUS 2 (TAT 6-24 HRS): SARS Coronavirus 2: NEGATIVE

## 2018-12-31 MED ORDER — LOSARTAN POTASSIUM 25 MG PO TABS: 25.0000 mg | ORAL_TABLET | Freq: Every day | ORAL | 0 refills | Status: DC

## 2018-12-31 MED ORDER — JARDIANCE 10 MG PO TABS: 10.0000 mg | ORAL_TABLET | Freq: Every day | ORAL | 2 refills | Status: AC

## 2018-12-31 MED ORDER — ATORVASTATIN CALCIUM 40 MG PO TABS: 40.0000 mg | ORAL_TABLET | Freq: Every day | ORAL | 3 refills | Status: DC

## 2019-01-01 ENCOUNTER — Encounter: Payer: Self-pay | Admitting: Gastroenterology

## 2019-01-01 ENCOUNTER — Other Ambulatory Visit: Payer: Self-pay

## 2019-01-01 ENCOUNTER — Ambulatory Visit (AMBULATORY_SURGERY_CENTER): Payer: Managed Care, Other (non HMO) | Admitting: Gastroenterology

## 2019-01-01 VITALS — BP 143/92 | HR 71 | Temp 98.0°F | Resp 15 | Ht 72.0 in | Wt 294.0 lb

## 2019-01-01 DIAGNOSIS — K648 Other hemorrhoids: Secondary | ICD-10-CM | POA: Diagnosis not present

## 2019-01-01 DIAGNOSIS — D123 Benign neoplasm of transverse colon: Secondary | ICD-10-CM

## 2019-01-01 DIAGNOSIS — K625 Hemorrhage of anus and rectum: Secondary | ICD-10-CM

## 2019-01-01 LAB — HM COLONOSCOPY

## 2019-01-01 MED ORDER — SODIUM CHLORIDE 0.9 % IV SOLN: 500.0000 mL | Freq: Once | INTRAVENOUS | Status: DC

## 2019-01-01 NOTE — Patient Instructions (Signed)
Impression/Recommendations:  Polyp handout given to patient. Hemorrhoid handout given to patient. High fiber diet handout given to patient.  Resume previous diet.  Follow high fiber diet recommendations and drink plenty of water. Consider using a daily stool bulking agent such as Metamucil.  Continue present medications. Await pathology results.  Repeat colonoscopy in 5 years for surveillance, given family history.  YOU HAD AN ENDOSCOPIC PROCEDURE TODAY AT [facility]:   Refer to the procedure report that was given to you for any specific questions about what was found during the examination.  If the procedure report does not answer your questions, please call your gastroenterologist to clarify.  If you requested that your care partner not be given the details of your procedure findings, then the procedure report has been included in a sealed envelope for you to review at your convenience later.  YOU SHOULD EXPECT: Some feelings of bloating in the abdomen. Passage of more gas than usual.  Walking can help get rid of the air that was put into your GI tract during the procedure and reduce the bloating. If you had a lower endoscopy (such as a colonoscopy or flexible sigmoidoscopy) you may notice spotting of blood in your stool or on the toilet paper. If you underwent a bowel prep for your procedure, you may not have a normal bowel movement for a few days.  Please Note:  You might notice some irritation and congestion in your nose or some drainage.  This is from the oxygen used during your procedure.  There is no need for concern and it should clear up in a day or so.  SYMPTOMS TO REPORT IMMEDIATELY:   Following lower endoscopy (colonoscopy or flexible sigmoidoscopy):  Excessive amounts of blood in the stool  Significant tenderness or worsening of abdominal pains  Swelling of the abdomen that is new, acute  Fever of 100F or higher  For urgent or emergent issues, a  gastroenterologist can be reached at any hour by calling [phone].   DIET:  We do recommend a small meal at first, but then you may proceed to your regular diet.  Drink plenty of fluids but you should avoid alcoholic beverages for 24 hours.  ACTIVITY:  You should plan to take it easy for the rest of today and you should NOT DRIVE or use heavy machinery until tomorrow (because of the sedation medicines used during the test).    FOLLOW UP: Our staff will call the number listed on your records 48-72 hours following your procedure to check on you and address any questions or concerns that you may have regarding the information given to you following your procedure. If we do not reach you, we will leave a message.  We will attempt to reach you two times.  During this call, we will ask if you have developed any symptoms of COVID 19. If you develop any symptoms (ie: fever, flu-like symptoms, shortness of breath, cough etc.) before then, please call [phone].  If you test positive for Covid 19 in the 2 weeks post procedure, please call and report this information to [place].    If any biopsies were taken you will be contacted by phone or by letter within the next 1-3 weeks.  Please call us at [phone] if you have not heard about the biopsies in 3 weeks.    SIGNATURES/CONFIDENTIALITY: You and/or your care partner have signed paperwork which will be entered into your electronic medical record.  These signatures attest  to the fact that that the information above on your After Visit Summary has been reviewed and is understood.  Full responsibility of the confidentiality of this discharge information lies with you and/or your care-partner.

## 2019-01-01 NOTE — Progress Notes (Signed)
To PACU, VSS. Report to [email or web address] 

## 2019-01-01 NOTE — Progress Notes (Signed)
Called to room to assist during endoscopic procedure.  Patient ID and intended procedure confirmed with present staff. Received instructions for my participation in the procedure from the performing physician.  

## 2019-01-01 NOTE — Op Note (Addendum)
[place] Patient Name: Alan Payne Procedure Date: 01/01/2019 11:04 AM MRN: [id number] Endoscopist: Thornton Park MD, MD Age: 48 Referring MD:  Date of Birth: Dec 20, 1970 Gender: Male Account #: [id number] Procedure:                Colonoscopy Indications:              Hematochezia                           Mother with colon polyps                           No known family history of colon cancer or polyps Medicines:                Monitored Anesthesia Care Procedure:                Pre-Anesthesia Assessment:                           - Prior to the procedure, a History and Physical                            was performed, and patient medications and                            allergies were reviewed. The patient's tolerance of                            previous anesthesia was also reviewed. The risks                            and benefits of the procedure and the sedation                            options and risks were discussed with the patient.                            All questions were answered, and informed consent                            was obtained. Prior Anticoagulants: The patient has                            taken no previous anticoagulant or antiplatelet                            agents. ASA Grade Assessment: III - A patient with                            severe systemic disease. After reviewing the risks                            and benefits, the patient was deemed in  satisfactory condition to undergo the procedure.                           After obtaining informed consent, the colonoscope                            was passed under direct vision. Throughout the                            procedure, the patient's blood pressure, pulse, and                            oxygen saturations were monitored continuously. The                            Colonoscope was introduced through the anus and           advanced to the the terminal ileum, with                            identification of the appendiceal orifice and IC                            valve. A second forward view of the right colon was                            performed. The colonoscopy was performed without                            difficulty. The patient tolerated the procedure                            well. The quality of the bowel preparation was                            excellent. The terminal ileum, ileocecal valve,                            appendiceal orifice, and rectum were photographed. Scope In: [id number] AM Scope Out: 11:25:32 AM Scope Withdrawal Time: 0 hours 7 minutes 33 seconds  Total Procedure Duration: 0 hours 9 minutes 21 seconds  Findings:                 A 2 mm polyp was found in the transverse colon. The                            polyp was sessile. The polyp was removed with a                            cold snare. Resection and retrieval were complete.                            Estimated blood loss was minimal.  Non-bleeding internal hemorrhoids were found during                            retroflexion.                           The exam was otherwise without abnormality on                            direct and retroflexion views. Complications:            No immediate complications. Estimated blood loss:                            Minimal. Estimated Blood Loss:     Estimated blood loss was minimal. Impression:               - One 2 mm polyp in the transverse colon, removed                            with a cold snare. Resected and retrieved.                           - Non-bleeding internal hemorrhoids.                           - The examination was otherwise normal on direct                            and retroflexion views. Recommendation:           - Patient has a contact number available for                            emergencies. The signs and symptoms of  potential                            delayed complications were discussed with the                            patient. Return to normal activities tomorrow.                            Written discharge instructions were provided to the                            patient.                           - Resume previous diet today. Follow a high fiber                            diet and drink plenty of water.                           - Consider using a daily stool bulking agent such  as Metamucil.                           - Continue present medications.                           - Await pathology results.                           - Repeat colonoscopy in 5 years for surveillance                            given the family history. Thornton Park MD, MD 01/01/2019 11:40:11 AM This report has been signed electronically.

## 2019-01-07 ENCOUNTER — Telehealth: Payer: Self-pay

## 2019-01-07 ENCOUNTER — Other Ambulatory Visit: Payer: Self-pay

## 2019-01-07 ENCOUNTER — Encounter: Payer: Self-pay | Admitting: Gastroenterology

## 2019-01-07 DIAGNOSIS — Z20822 Contact with and (suspected) exposure to covid-19: Secondary | ICD-10-CM

## 2019-01-07 NOTE — Telephone Encounter (Signed)
Left message on follow up call. 

## 2019-01-07 NOTE — Telephone Encounter (Signed)
  Follow up Call-  Call back number 01/01/2019  Post procedure Call Back phone  # [phone]  Permission to leave phone message Yes  Some recent data might be hidden     Patient questions:  Do you have a fever, pain , or abdominal swelling? No. Pain Score  0 *  Have you tolerated food without any problems? Yes.    Have you been able to return to your normal activities? Yes.    Do you have any questions about your discharge instructions: Diet   No. Medications  No. Follow up visit  No.  Do you have questions or concerns about your Care? No.  Actions: * If pain score is 4 or above: No action needed, pain <4. 1. Have you developed a fever since your procedure? no  2.   Have you had an respiratory symptoms (SOB or cough) since your procedure? no  3.   Have you tested positive for COVID 19 since your procedure no  4.   Have you had any family members/close contacts diagnosed with the COVID 19 since your procedure?  no   If yes to any of these questions please route to Joylene John, RN and Alphonsa Gin, Therapist, sports.

## 2019-01-09 LAB — NOVEL CORONAVIRUS, NAA: SARS-CoV-2, NAA: NOT DETECTED

## 2019-01-10 ENCOUNTER — Encounter: Payer: Managed Care, Other (non HMO) | Admitting: Gastroenterology

## 2019-01-13 ENCOUNTER — Encounter: Payer: Self-pay | Admitting: Family Medicine

## 2019-01-15 ENCOUNTER — Ambulatory Visit: Payer: Managed Care, Other (non HMO)

## 2019-01-25 ENCOUNTER — Encounter: Payer: Self-pay | Admitting: Family Medicine

## 2019-01-28 ENCOUNTER — Other Ambulatory Visit: Payer: Self-pay

## 2019-01-29 ENCOUNTER — Ambulatory Visit (INDEPENDENT_AMBULATORY_CARE_PROVIDER_SITE_OTHER): Payer: Managed Care, Other (non HMO)

## 2019-01-29 DIAGNOSIS — E538 Deficiency of other specified B group vitamins: Secondary | ICD-10-CM

## 2019-01-29 MED ORDER — CYANOCOBALAMIN 1000 MCG/ML IJ SOLN
1000.0000 ug | Freq: Once | INTRAMUSCULAR | Status: AC
  Administered 2019-01-29: 09:00:00 1000 ug via INTRAMUSCULAR

## 2019-01-29 NOTE — Progress Notes (Signed)
Per orders of Dr. Jerline Pain, injection of Vitamin b12, 1000 mcg given left deltoid, IM by Kevan Ny, CMA Patient tolerated injection well.  He will return in 1 week for his next injection.

## 2019-02-05 ENCOUNTER — Other Ambulatory Visit: Payer: Self-pay

## 2019-02-05 ENCOUNTER — Ambulatory Visit (INDEPENDENT_AMBULATORY_CARE_PROVIDER_SITE_OTHER): Payer: Managed Care, Other (non HMO)

## 2019-02-05 DIAGNOSIS — E538 Deficiency of other specified B group vitamins: Secondary | ICD-10-CM

## 2019-02-05 MED ORDER — CYANOCOBALAMIN 1000 MCG/ML IJ SOLN
1000.0000 ug | Freq: Once | INTRAMUSCULAR | Status: AC
  Administered 2019-02-05: 1000 ug via INTRAMUSCULAR

## 2019-02-05 NOTE — Progress Notes (Signed)
Per orders of Dr. Jerline Pain, injection of B12 given by Francella Solian in left deltoid. Patient tolerated injection well. Patient will make appointment for 1 week.

## 2019-02-13 ENCOUNTER — Other Ambulatory Visit: Payer: Self-pay

## 2019-02-13 ENCOUNTER — Ambulatory Visit (INDEPENDENT_AMBULATORY_CARE_PROVIDER_SITE_OTHER): Payer: Managed Care, Other (non HMO)

## 2019-02-13 DIAGNOSIS — E538 Deficiency of other specified B group vitamins: Secondary | ICD-10-CM | POA: Diagnosis not present

## 2019-02-13 MED ORDER — CYANOCOBALAMIN 1000 MCG/ML IJ SOLN
1000.0000 ug | Freq: Once | INTRAMUSCULAR | Status: AC
  Administered 2019-02-13: 1000 ug via INTRAMUSCULAR

## 2019-02-13 NOTE — Progress Notes (Addendum)
Per orders of Dr.Parker, injection of Vitamin B 12 given by Khamika Anderson. Patient tolerated injection well.  

## 2019-02-20 ENCOUNTER — Other Ambulatory Visit: Payer: Self-pay

## 2019-02-20 ENCOUNTER — Ambulatory Visit (INDEPENDENT_AMBULATORY_CARE_PROVIDER_SITE_OTHER): Payer: Managed Care, Other (non HMO)

## 2019-02-20 DIAGNOSIS — E538 Deficiency of other specified B group vitamins: Secondary | ICD-10-CM

## 2019-02-20 MED ORDER — CYANOCOBALAMIN 1000 MCG/ML IJ SOLN
1000.0000 ug | Freq: Once | INTRAMUSCULAR | Status: AC
  Administered 2019-02-20: 1000 ug via INTRAMUSCULAR

## 2019-02-20 NOTE — Progress Notes (Signed)
Per orders of Dr. Jerline Pain, injection of Vitamin b12, 1000 mcg given right deltoid IM by Kevan Ny, CMA Patient tolerated injection well.  Patient will return in 1 month for his next injection.

## 2019-02-25 ENCOUNTER — Telehealth: Payer: Self-pay

## 2019-02-25 NOTE — Telephone Encounter (Signed)
Patient had appointment for b12 injection for 1/19, Last injection on 1/13 was last weekly injection.  Spoke to patient and advised him he will now come monthly for his injections.  Rescheduled nurse visit for b12 injection to 2/16.  Patient verbalized understanding.

## 2019-02-26 ENCOUNTER — Ambulatory Visit: Payer: Managed Care, Other (non HMO)

## 2019-03-26 ENCOUNTER — Ambulatory Visit: Payer: Managed Care, Other (non HMO)

## 2019-03-27 ENCOUNTER — Other Ambulatory Visit: Payer: Self-pay

## 2019-03-27 ENCOUNTER — Ambulatory Visit (INDEPENDENT_AMBULATORY_CARE_PROVIDER_SITE_OTHER): Payer: Managed Care, Other (non HMO)

## 2019-03-27 DIAGNOSIS — E538 Deficiency of other specified B group vitamins: Secondary | ICD-10-CM

## 2019-03-27 MED ORDER — CYANOCOBALAMIN 1000 MCG/ML IJ SOLN
1000.0000 ug | Freq: Once | INTRAMUSCULAR | Status: AC
  Administered 2019-03-27: 1000 ug via INTRAMUSCULAR

## 2019-03-27 NOTE — Progress Notes (Signed)
Per orders of Dr. Jerline Pain, injection of B-12 given by Francella Solian in left deltoid. Patient tolerated injection well. Patient will make appointment for 1 month.

## 2019-04-09 ENCOUNTER — Other Ambulatory Visit: Payer: Self-pay

## 2019-04-09 MED ORDER — METFORMIN HCL 1000 MG PO TABS: 1000.0000 mg | ORAL_TABLET | Freq: Two times a day (BID) | ORAL | 3 refills | Status: DC

## 2019-04-13 ENCOUNTER — Other Ambulatory Visit: Payer: Self-pay | Admitting: Family Medicine

## 2019-04-24 ENCOUNTER — Ambulatory Visit (INDEPENDENT_AMBULATORY_CARE_PROVIDER_SITE_OTHER): Payer: Managed Care, Other (non HMO)

## 2019-04-24 ENCOUNTER — Other Ambulatory Visit: Payer: Self-pay

## 2019-04-24 DIAGNOSIS — E538 Deficiency of other specified B group vitamins: Secondary | ICD-10-CM | POA: Diagnosis not present

## 2019-04-24 MED ORDER — CYANOCOBALAMIN 1000 MCG/ML IJ SOLN
1000.0000 ug | Freq: Once | INTRAMUSCULAR | Status: AC
  Administered 2019-04-24: 1000 ug via INTRAMUSCULAR

## 2019-04-24 NOTE — Progress Notes (Signed)
Per orders of Dr. Jerline Pain, injection of B-12 given by Francella Solian in right deltoid. Patient tolerated injection well. Patient will make appointment for 1 month.

## 2019-05-03 LAB — HM DIABETES EYE EXAM

## 2019-05-06 ENCOUNTER — Encounter: Payer: Self-pay | Admitting: Family Medicine

## 2019-05-28 ENCOUNTER — Other Ambulatory Visit: Payer: Self-pay

## 2019-05-28 ENCOUNTER — Ambulatory Visit (INDEPENDENT_AMBULATORY_CARE_PROVIDER_SITE_OTHER): Payer: Managed Care, Other (non HMO) | Admitting: *Deleted

## 2019-05-28 DIAGNOSIS — E538 Deficiency of other specified B group vitamins: Secondary | ICD-10-CM

## 2019-05-28 MED ORDER — CYANOCOBALAMIN 1000 MCG/ML IJ SOLN
1000.0000 ug | Freq: Once | INTRAMUSCULAR | Status: AC
  Administered 2019-05-28: 1000 ug via INTRAMUSCULAR

## 2019-05-28 NOTE — Progress Notes (Signed)
Fain Pinn. is a 49 y.o. male presents to the office today for cyanocobalamin injections, per physician's orders. Original order: 12/27/2018 cyanocobalamin ((VITAMIN B-12)) injection 1,000 mcg  Given Lt deltoid IM   today. Patient tolerated injection.   Betti Cruz

## 2019-06-27 ENCOUNTER — Other Ambulatory Visit: Payer: Self-pay

## 2019-06-27 ENCOUNTER — Ambulatory Visit: Payer: Managed Care, Other (non HMO)

## 2019-06-27 ENCOUNTER — Other Ambulatory Visit: Payer: Self-pay | Admitting: *Deleted

## 2019-06-27 ENCOUNTER — Other Ambulatory Visit (INDEPENDENT_AMBULATORY_CARE_PROVIDER_SITE_OTHER): Payer: Managed Care, Other (non HMO)

## 2019-06-27 DIAGNOSIS — E538 Deficiency of other specified B group vitamins: Secondary | ICD-10-CM

## 2019-06-27 LAB — VITAMIN B12: Vitamin B-12: 402 pg/mL (ref 211–911)

## 2019-06-27 NOTE — Progress Notes (Signed)
Please inform patient of the following:  B12 is better but still on low side of normal. He can transition to oral supplmentation with 1048mcg daily and we can recheck the next time that we get blood work.   He needs f/u OV soon to recheck his A1c.

## 2019-07-07 ENCOUNTER — Other Ambulatory Visit: Payer: Self-pay | Admitting: Family Medicine

## 2019-08-02 ENCOUNTER — Other Ambulatory Visit: Payer: Self-pay | Admitting: Family Medicine

## 2019-08-06 ENCOUNTER — Ambulatory Visit: Payer: Managed Care, Other (non HMO)

## 2019-08-06 ENCOUNTER — Other Ambulatory Visit: Payer: Self-pay

## 2019-08-06 ENCOUNTER — Ambulatory Visit (INDEPENDENT_AMBULATORY_CARE_PROVIDER_SITE_OTHER): Payer: Managed Care, Other (non HMO)

## 2019-08-06 DIAGNOSIS — E538 Deficiency of other specified B group vitamins: Secondary | ICD-10-CM | POA: Diagnosis not present

## 2019-08-06 MED ORDER — CYANOCOBALAMIN 1000 MCG/ML IJ SOLN
1000.0000 ug | Freq: Once | INTRAMUSCULAR | Status: AC
  Administered 2019-08-06: 1000 ug via INTRAMUSCULAR

## 2019-08-06 NOTE — Progress Notes (Signed)
Per orders of Dr. Jerline Pain, injection of Vitamin b12 given by Tobe Sos in left deltoid. Patient tolerated injection well. Patient will make appointment for 1 month.

## 2019-08-29 ENCOUNTER — Other Ambulatory Visit: Payer: Self-pay | Admitting: Family Medicine

## 2019-09-02 ENCOUNTER — Ambulatory Visit: Payer: Managed Care, Other (non HMO) | Admitting: Family Medicine

## 2019-09-03 ENCOUNTER — Other Ambulatory Visit: Payer: Self-pay

## 2019-09-03 ENCOUNTER — Ambulatory Visit: Payer: Managed Care, Other (non HMO) | Admitting: Family Medicine

## 2019-09-03 ENCOUNTER — Encounter: Payer: Self-pay | Admitting: Family Medicine

## 2019-09-03 VITALS — BP 126/80 | HR 86 | Temp 97.9°F | Resp 18 | Ht 72.0 in | Wt 289.4 lb

## 2019-09-03 DIAGNOSIS — I10 Essential (primary) hypertension: Secondary | ICD-10-CM

## 2019-09-03 DIAGNOSIS — E119 Type 2 diabetes mellitus without complications: Secondary | ICD-10-CM

## 2019-09-03 DIAGNOSIS — M545 Low back pain, unspecified: Secondary | ICD-10-CM

## 2019-09-03 DIAGNOSIS — E538 Deficiency of other specified B group vitamins: Secondary | ICD-10-CM | POA: Insufficient documentation

## 2019-09-03 LAB — POCT GLYCOSYLATED HEMOGLOBIN (HGB A1C): Hemoglobin A1C: 10 % — AB (ref 4.0–5.6)

## 2019-09-03 MED ORDER — CYANOCOBALAMIN 1000 MCG/ML IJ SOLN
1000.0000 ug | Freq: Once | INTRAMUSCULAR | Status: AC
  Administered 2019-09-03: 1000 ug via INTRAMUSCULAR

## 2019-09-03 NOTE — Addendum Note (Signed)
Addended by: Thomes Cake on: 09/03/2019 02:25 PM   Modules accepted: Orders

## 2019-09-03 NOTE — Assessment & Plan Note (Signed)
Injection given today. 

## 2019-09-03 NOTE — Patient Instructions (Signed)
It was very nice to see you today!  We will check a urine sample today.  I think you have a strain in your gluteal muscles.  Please work on exercises.  Your A1c is better but still not at goal.  Please continue working on diet and exercise.  I will see you back in 3 months. Please come back to see me sooner if needed.  Take care, Dr Jerline Pain  Please try these tips to maintain a healthy lifestyle:   Eat at least 3 REAL meals and 1-2 snacks per day.  Aim for no more than 5 hours between eating.  If you eat breakfast, please do so within one hour of getting up.    Each meal should contain half fruits/vegetables, one quarter protein, and one quarter carbs (no bigger than a computer mouse)   Cut down on sweet beverages. This includes juice, soda, and sweet tea.     Drink at least 1 glass of water with each meal and aim for at least 8 glasses per day   Exercise at least 150 minutes every week.

## 2019-09-03 NOTE — Addendum Note (Signed)
Addended by: Doran Clay A on: 09/03/2019 02:19 PM   Modules accepted: Orders

## 2019-09-03 NOTE — Progress Notes (Signed)
   Alan Payne. is a 49 y.o. male who presents today for an office visit.  Assessment/Plan:  Chronic Problems Addressed Today: Type 2 diabetes mellitus without complication, without long-term current use of insulin (HCC) A1c 10.  Advised patient this is better but still not at goal.  He would like to continue working on diet and exercise.  We will continue Jardiance 10 mg daily and Metformin 1000 mg twice daily.  Recheck A1c in 3 months.  He has had some tinea infection related to [facility] and like to stop this medication if possible.  May be a candidate for Ozempic depending on A1c in 3 months.  Essential hypertension At goal.  Continue losartan 25 mg daily.  B12 deficiency Injection given today.      Subjective:  HPI:  Patient here with right hip Payne for the past several weeks.  Also located more lower back.  No obvious injuries though he has been doing more exercises recently.  Hurts when sitting down.  Worse with certain motions.  Tried Tylenol and tramadol with some improvement.  No fevers or chills.  No reported weakness or numbness.  He has been trying to lose weight.  Working on diet and exercise but over the last few weeks he stopped following his diet.       Objective:  Physical Exam: BP 126/80   Pulse 86   Temp 97.9 F (36.6 C) (Temporal)   Resp 18   Ht 6' (1.829 m)   Wt (!) 289 lb 6.4 oz (131.3 kg)   SpO2 96%   BMI 39.25 kg/m   Wt Readings from Last 3 Encounters:  09/03/19 (!) 289 lb 6.4 oz (131.3 kg)  01/01/19 294 lb (133.4 kg)  12/27/18 294 lb (133.4 kg)    Gen: No acute distress, resting comfortably CV: Regular rate and rhythm with no murmurs appreciated Pulm: Normal work of breathing, clear to auscultation bilaterally with no crackles, wheezes, or rhonchi MSK: -Back: No deformities.  Tender palpation along right lower lumbar spine and gluteal muscle group.  Payne elicited with external rotation.  Neuro: Grossly normal, moves all  extremities Psych: Normal affect and thought content      Alan Chea M. Jerline Pain, MD 09/03/2019 2:10 PM

## 2019-09-03 NOTE — Assessment & Plan Note (Signed)
A1c 10.  Advised patient this is better but still not at goal.  He would like to continue working on diet and exercise.  We will continue Jardiance 10 mg daily and Metformin 1000 mg twice daily.  Recheck A1c in 3 months.  He has had some tinea infection related to [facility] and like to stop this medication if possible.  May be a candidate for Ozempic depending on A1c in 3 months.

## 2019-09-03 NOTE — Assessment & Plan Note (Signed)
At goal.  Continue losartan 25 mg daily. 

## 2019-09-04 LAB — URINALYSIS, ROUTINE W REFLEX MICROSCOPIC
Bacteria, UA: NONE SEEN /HPF
Bilirubin Urine: NEGATIVE
Hgb urine dipstick: NEGATIVE
Hyaline Cast: NONE SEEN /LPF
Ketones, ur: NEGATIVE
Leukocytes,Ua: NEGATIVE
Nitrite: NEGATIVE
RBC / HPF: NONE SEEN /HPF (ref 0–2)
Specific Gravity, Urine: 1.026 (ref 1.001–1.03)
Squamous Epithelial / HPF: NONE SEEN /HPF (ref <=5)
pH: 5.5 (ref 5.0–8.0)

## 2019-09-04 NOTE — Progress Notes (Signed)
Please inform patient of the following:  Good news! - Urine sample shows no blood. He does not have a kidney stone. Would like for him to let us know if his symptoms are not improving.  Algis Greenhouse. Jerline Pain, MD 09/04/2019 11:50 AM

## 2019-09-05 ENCOUNTER — Ambulatory Visit: Payer: Managed Care, Other (non HMO)

## 2019-10-01 ENCOUNTER — Other Ambulatory Visit: Payer: Self-pay | Admitting: Family Medicine

## 2019-10-02 ENCOUNTER — Other Ambulatory Visit: Payer: Self-pay

## 2019-10-02 ENCOUNTER — Ambulatory Visit: Payer: Managed Care, Other (non HMO) | Admitting: Family Medicine

## 2019-10-02 ENCOUNTER — Encounter: Payer: Self-pay | Admitting: Family Medicine

## 2019-10-02 VITALS — BP 146/85 | HR 89 | Temp 97.9°F | Ht 72.0 in | Wt 289.4 lb

## 2019-10-02 DIAGNOSIS — E119 Type 2 diabetes mellitus without complications: Secondary | ICD-10-CM

## 2019-10-02 DIAGNOSIS — E538 Deficiency of other specified B group vitamins: Secondary | ICD-10-CM | POA: Diagnosis not present

## 2019-10-02 MED ORDER — CYANOCOBALAMIN 1000 MCG/ML IJ SOLN
1000.0000 ug | Freq: Once | INTRAMUSCULAR | Status: AC
Start: 2019-10-02 — End: 2019-10-02
  Administered 2019-10-02: 1000 ug via INTRAMUSCULAR

## 2019-10-02 MED ORDER — RYBELSUS 3 MG PO TABS: 3.0000 mg | ORAL_TABLET | Freq: Every day | ORAL | 5 refills | Status: DC

## 2019-10-02 NOTE — Assessment & Plan Note (Signed)
Last A1c 10. Having side effects of Jardiance. We will switch to Rybelsus. Continue Metformin 1000 mg twice daily. Recheck A1c in 2 to 3 months.

## 2019-10-02 NOTE — Progress Notes (Signed)
   Alan Payne. is a 49 y.o. male who presents today for an office visit.  Assessment/Plan:  Chronic Problems Addressed Today: B12 deficiency Injection given today.   Type 2 diabetes mellitus without complication, without long-term current use of insulin (HCC) Last A1c 10. Having side effects of Jardiance. We will switch to Rybelsus. Continue Metformin 1000 mg twice daily. Recheck A1c in 2 to 3 months.     Subjective:  HPI:  See A/p.         Objective:  Physical Exam: BP (!) 146/85   Pulse 89   Temp 97.9 F (36.6 C)   Ht 6' (1.829 m)   Wt 289 lb 6.4 oz (131.3 kg)   SpO2 98%   BMI 39.25 kg/m   Wt Readings from Last 3 Encounters:  10/02/19 289 lb 6.4 oz (131.3 kg)  09/03/19 (!) 289 lb 6.4 oz (131.3 kg)  01/01/19 294 lb (133.4 kg)  Gen: No acute distress, resting comfortably Neuro: Grossly normal, moves all extremities Psych: Normal affect and thought content      Maggie Senseney M. Jerline Pain, MD 10/02/2019 11:02 AM

## 2019-10-02 NOTE — Assessment & Plan Note (Signed)
Injection given today. 

## 2019-10-02 NOTE — Patient Instructions (Signed)
It was very nice to see you today!  Stop the Jardiance.  Start Rybelsus.  Please come back in 2 to 3 months to recheck your A1c.  Take care, Dr Jerline Pain  Please try these tips to maintain a healthy lifestyle:   Eat at least 3 REAL meals and 1-2 snacks per day.  Aim for no more than 5 hours between eating.  If you eat breakfast, please do so within one hour of getting up.    Each meal should contain half fruits/vegetables, one quarter protein, and one quarter carbs (no bigger than a computer mouse)   Cut down on sweet beverages. This includes juice, soda, and sweet tea.     Drink at least 1 glass of water with each meal and aim for at least 8 glasses per day   Exercise at least 150 minutes every week.

## 2019-12-27 ENCOUNTER — Other Ambulatory Visit: Payer: Self-pay | Admitting: Family Medicine

## 2020-01-10 ENCOUNTER — Encounter: Payer: Self-pay | Admitting: Family Medicine

## 2020-01-10 ENCOUNTER — Ambulatory Visit: Payer: Managed Care, Other (non HMO) | Admitting: Family Medicine

## 2020-01-10 ENCOUNTER — Other Ambulatory Visit: Payer: Self-pay

## 2020-01-10 ENCOUNTER — Ambulatory Visit (INDEPENDENT_AMBULATORY_CARE_PROVIDER_SITE_OTHER): Payer: Managed Care, Other (non HMO) | Admitting: Family Medicine

## 2020-01-10 VITALS — BP 138/82 | HR 65 | Temp 98.2°F | Ht 72.0 in | Wt 290.8 lb

## 2020-01-10 DIAGNOSIS — E1169 Type 2 diabetes mellitus with other specified complication: Secondary | ICD-10-CM

## 2020-01-10 DIAGNOSIS — E119 Type 2 diabetes mellitus without complications: Secondary | ICD-10-CM | POA: Insufficient documentation

## 2020-01-10 DIAGNOSIS — E785 Hyperlipidemia, unspecified: Secondary | ICD-10-CM

## 2020-01-10 DIAGNOSIS — E538 Deficiency of other specified B group vitamins: Secondary | ICD-10-CM | POA: Diagnosis not present

## 2020-01-10 DIAGNOSIS — I10 Essential (primary) hypertension: Secondary | ICD-10-CM | POA: Diagnosis not present

## 2020-01-10 DIAGNOSIS — I152 Hypertension secondary to endocrine disorders: Secondary | ICD-10-CM

## 2020-01-10 DIAGNOSIS — Z0001 Encounter for general adult medical examination with abnormal findings: Secondary | ICD-10-CM

## 2020-01-10 DIAGNOSIS — E1159 Type 2 diabetes mellitus with other circulatory complications: Secondary | ICD-10-CM

## 2020-01-10 DIAGNOSIS — M109 Gout, unspecified: Secondary | ICD-10-CM

## 2020-01-10 DIAGNOSIS — Z23 Encounter for immunization: Secondary | ICD-10-CM

## 2020-01-10 DIAGNOSIS — Z125 Encounter for screening for malignant neoplasm of prostate: Secondary | ICD-10-CM

## 2020-01-10 DIAGNOSIS — Z13220 Encounter for screening for lipoid disorders: Secondary | ICD-10-CM

## 2020-01-10 MED ORDER — INSULIN LISPRO 100 UNIT/ML ~~LOC~~ SOLN: 5.0000 [IU] | Freq: Every day | SUBCUTANEOUS | 11 refills | Status: AC

## 2020-01-10 NOTE — Assessment & Plan Note (Signed)
Above goal.  Has previously been well controlled.  Will continue losartan 25 mg daily.  He will continue home monitoring with goal 140/90 or lower limit of persistently elevated.

## 2020-01-10 NOTE — Patient Instructions (Signed)
It was very nice to see you today!  We will check blood work today.  Depending on your blood sugar we will switch Rybelsus to Ozempic.  Please keep in on your blood pressure let me know if persistently 140/90 or higher.  You probably have a small amount of bursitis in your knee.  This should improve over the next couple weeks.  You have mild carpal tunnel.  This is nothing to worry about.  Please try to keep pressure off of the wrist and elbow.  I will see back in 3 to 6 months depending on the results of your A1c.  Take care, Dr Jerline Pain  Please try these tips to maintain a healthy lifestyle:   Eat at least 3 REAL meals and 1-2 snacks per day.  Aim for no more than 5 hours between eating.  If you eat breakfast, please do so within one hour of getting up.    Each meal should contain half fruits/vegetables, one quarter protein, and one quarter carbs (no bigger than a computer mouse)   Cut down on sweet beverages. This includes juice, soda, and sweet tea.     Drink at least 1 glass of water with each meal and aim for at least 8 glasses per day   Exercise at least 150 minutes every week.    Preventive Care 68-96 Years Old, Male Preventive care refers to lifestyle choices and visits with your health care provider that can promote health and wellness. This includes:  A yearly physical exam. This is also called an annual well check.  Regular dental and eye exams.  Immunizations.  Screening for certain conditions.  Healthy lifestyle choices, such as eating a healthy diet, getting regular exercise, not using drugs or products that contain nicotine and tobacco, and limiting alcohol use. What can I expect for my preventive care visit? Physical exam Your health care provider will check:  Height and weight. These may be used to calculate body mass index (BMI), which is a measurement that tells if you are at a healthy weight.  Heart rate and blood pressure.  Your skin for  abnormal spots. Counseling Your health care provider may ask you questions about:  Alcohol, tobacco, and drug use.  Emotional well-being.  Home and relationship well-being.  Sexual activity.  Eating habits.  Work and work Statistician. What immunizations do I need?  Influenza (flu) vaccine  This is recommended every year. Tetanus, diphtheria, and pertussis (Tdap) vaccine  You may need a Td booster every 10 years. Varicella (chickenpox) vaccine  You may need this vaccine if you have not already been vaccinated. Zoster (shingles) vaccine  You may need this after age 8. Measles, mumps, and rubella (MMR) vaccine  You may need at least one dose of MMR if you were born in 1957 or later. You may also need a second dose. Pneumococcal conjugate (PCV13) vaccine  You may need this if you have certain conditions and were not previously vaccinated. Pneumococcal polysaccharide (PPSV23) vaccine  You may need one or two doses if you smoke cigarettes or if you have certain conditions. Meningococcal conjugate (MenACWY) vaccine  You may need this if you have certain conditions. Hepatitis A vaccine  You may need this if you have certain conditions or if you travel or work in places where you may be exposed to hepatitis A. Hepatitis B vaccine  You may need this if you have certain conditions or if you travel or work in places where you may be exposed  to hepatitis B. Haemophilus influenzae type b (Hib) vaccine  You may need this if you have certain risk factors. Human papillomavirus (HPV) vaccine  If recommended by your health care provider, you may need three doses over 6 months. You may receive vaccines as individual doses or as more than one vaccine together in one shot (combination vaccines). Talk with your health care provider about the risks and benefits of combination vaccines. What tests do I need? Blood tests  Lipid and cholesterol levels. These may be checked every 5  years, or more frequently if you are over 79 years old.  Hepatitis C test.  Hepatitis B test. Screening  Lung cancer screening. You may have this screening every year starting at age 59 if you have a 30-pack-year history of smoking and currently smoke or have quit within the past 15 years.  Prostate cancer screening. Recommendations will vary depending on your family history and other risks.  Colorectal cancer screening. All adults should have this screening starting at age 16 and continuing until age 101. Your health care provider may recommend screening at age 49 if you are at increased risk. You will have tests every 1-10 years, depending on your results and the type of screening test.  Diabetes screening. This is done by checking your blood sugar (glucose) after you have not eaten for a while (fasting). You may have this done every 1-3 years.  Sexually transmitted disease (STD) testing. Follow these instructions at home: Eating and drinking  Eat a diet that includes fresh fruits and vegetables, whole grains, lean protein, and low-fat dairy products.  Take vitamin and mineral supplements as recommended by your health care provider.  Do not drink alcohol if your health care provider tells you not to drink.  If you drink alcohol: ? Limit how much you have to 0-2 drinks a day. ? Be aware of how much alcohol is in your drink. In the [place] one drink equals one 12 oz bottle of beer (355 mL), one 5 oz glass of wine (148 mL), or one 1 oz glass of hard liquor (44 mL). Lifestyle  Take daily care of your teeth and gums.  Stay active. Exercise for at least 30 minutes on 5 or more days each week.  Do not use any products that contain nicotine or tobacco, such as cigarettes, e-cigarettes, and chewing tobacco. If you need help quitting, ask your health care provider.  If you are sexually active, practice safe sex. Use a condom or other form of protection to prevent STIs (sexually transmitted  infections).  Talk with your health care provider about taking a low-dose aspirin every day starting at age 109. What's next?  Go to your health care provider once a year for a well check visit.  Ask your health care provider how often you should have your eyes and teeth checked.  Stay up to date on all vaccines. This information is not intended to replace advice given to you by your health care provider. Make sure you discuss any questions you have with your health care provider. Document Revised: 01/18/2018 Document Reviewed: 01/18/2018 Elsevier Patient Education  2020 [employer].

## 2020-01-10 NOTE — Assessment & Plan Note (Signed)
Stable. No recent flares.  

## 2020-01-10 NOTE — Assessment & Plan Note (Signed)
Check A1c.  He would like to switch from Rybelsus to [place].  Depending on A1c will likely switch to 0.5 mg weekly of Ozempic.  We will continue Metformin 1000 mg twice a day.  We will also send in as needed Humalog.  He would like to have this on hand for hyperglycemic episodes.

## 2020-01-10 NOTE — Progress Notes (Signed)
Chief Complaint:  Alan Payne. is a 49 y.o. male who presents today for his annual comprehensive physical exam.    Assessment/Plan:  New/Acute Problems: Knee Pain Likely contusion versus mild prepatellar bursitis.  No red flags.  Recommended ice and compression.  Anticipate resolving over the next couple of weeks.  Hand Numbness Positive Tinel's sign consistent with carpal tunnel.  Symptoms are currently mild and only occur during certain positions.  Discussed importance of avoiding placing excess pressure at wrist and elbow.  Chronic Problems Addressed Today: Dyslipidemia associated with type 2 diabetes mellitus (HCC) Check lipids.  Continue Lipitor 40 mg daily.  B12 deficiency Check B12.  Gout Stable.  No recent flares.  Hypertension associated with diabetes ([place]) Above goal.  Has previously been well controlled.  Will continue losartan 25 mg daily.  He will continue home monitoring with goal 140/90 or lower limit of persistently elevated.  Type 2 diabetes mellitus without complication, without long-term current use of insulin (HCC) Check A1c.  He would like to switch from Rybelsus to [place].  Depending on A1c will likely switch to 0.5 mg weekly of Ozempic.  We will continue Metformin 1000 mg twice a day.  We will also send in as needed Humalog.  He would like to have this on hand for hyperglycemic episodes.    Body mass index is 39.44 kg/m. / Obese    Preventative Healthcare: Flu vaccine given today.  Colonoscopy was done last year.  Up-to-date on other vaccines.  Check CBC, CMET, TSH, lipid panel, and PSA.  Patient Counseling(The following topics were reviewed and/or handout was given):  -Nutrition: Stressed importance of moderation in sodium/caffeine intake, saturated fat and cholesterol, caloric balance, sufficient intake of fresh fruits, vegetables, and fiber.  -Stressed the importance of regular exercise.   -Substance Abuse: Discussed cessation/primary  prevention of tobacco, alcohol, or other drug use; driving or other dangerous activities under the influence; availability of treatment for abuse.   -Injury prevention: Discussed safety belts, safety helmets, smoke detector, smoking near bedding or upholstery.   -Sexuality: Discussed sexually transmitted diseases, partner selection, use of condoms, avoidance of unintended pregnancy and contraceptive alternatives.   -Dental health: Discussed importance of regular tooth brushing, flossing, and dental visits.  -Health maintenance and immunizations reviewed. Please refer to Health maintenance section.  Return to care in 1 year for next preventative visit.     Subjective:  HPI:  He has had left knee pain for the last couple of weeks.  Seem to be improving.  [place] a couple weeks ago.  Landed directly on left knee.  Pain has been tolerable.  Has taken Excedrin which seems to help  Also has intermittent tingling in left hand.  Typically only occurs at night when looking at phone in bed.  Lifestyle Diet: None specific.  Exercise: Limited.   Depression screen PHQ 2/9 01/10/2020  Decreased Interest 0  Down, Depressed, Hopeless 0  PHQ - 2 Score 0    Health Maintenance Due  Topic Date Due  . Hepatitis C Screening  Never done  . INFLUENZA VACCINE  09/08/2019  . FOOT EXAM  12/27/2019     ROS: Per HPI, otherwise a complete review of systems was negative.   PMH:  The following were reviewed and entered/updated in epic: Past Medical History:  Diagnosis Date  . Acute renal failure (ARF) ([place]) 11/30/2017  . Bilateral ureteral calculi   . Constipation   . E-coli UTI 03/2013  . Gout   .  Grade I diastolic dysfunction 24/40/1027   Noted on ECHO  . Hematuria 03/25/2012  . History of kidney stones 11/30/2017   Left renal stone  . Hydronephrosis, right   . Hypertension   . LVH (left ventricular hypertrophy) 11/27/2017   Moderate, noted on ECHO  . Morbid obesity ([place])   . OSA on CPAP   .  Sinus tachycardia   . Sleep apnea   . Type 2 diabetes mellitus ([place])   . Wears glasses    Patient Active Problem List   Diagnosis Date Noted  . Dyslipidemia associated with type 2 diabetes mellitus ([place]) 01/10/2020  . B12 deficiency 09/03/2019  . Gout 11/30/2017  . Hypertension associated with diabetes ([place]) 11/26/2017  . History of kidney stones 02/19/2016  . OSA (obstructive sleep apnea) 02/19/2016  . Type 2 diabetes mellitus without complication, without long-term current use of insulin ([place]) 02/19/2016   Past Surgical History:  Procedure Laterality Date  . CIRCUMCISION  1993  . CYSTOSCOPY W/ URETERAL STENT PLACEMENT Left 11/25/2017   Procedure: CYSTOSCOPY WITH RETROGRADE PYELOGRAM/URETERAL STENT PLACEMENT;  Surgeon: Cleon Gustin, MD;  Location: [facility];  Service: Urology;  Laterality: Left;  . CYSTOSCOPY WITH RETROGRADE PYELOGRAM, URETEROSCOPY AND STENT PLACEMENT Left 12/11/2017   Procedure: CYSTOSCOPY WITH RETROGRADE PYELOGRAM, URETEROSCOPY AND STENT PLACEMENT;  Surgeon: Cleon Gustin, MD;  Location: [facility];  Service: Urology;  Laterality: Left;  1 HR  . EXTRACORPOREAL SHOCK WAVE LITHOTRIPSY Left 07-21-2014  . HOLMIUM LASER APPLICATION Left 25/04/6642   Procedure: HOLMIUM LASER APPLICATION;  Surgeon: Cleon Gustin, MD;  Location: [facility];  Service: Urology;  Laterality: Left;  Marland Kitchen VASECTOMY      Family History  Problem Relation Age of Onset  . Kidney cancer Father   . Hypertension Father   . Cancer Father        kidney  . Diabetes Mother   . Prostate cancer Neg Hx   . Colon cancer Neg Hx   . Rectal cancer Neg Hx   . Stomach cancer Neg Hx   . Esophageal cancer Neg Hx     Medications- reviewed and updated Current Outpatient Medications  Medication Sig Dispense Refill  . atorvastatin (LIPITOR) 40 MG tablet TAKE 1 TABLET BY MOUTH EVERY DAY 90 tablet 3  . Ginkgo Biloba 40 MG TABS Take 1 tablet by mouth every evening.      Marland Kitchen losartan (COZAAR) 25 MG tablet TAKE 1 TABLET BY MOUTH EVERY DAY 30 tablet 5  . metFORMIN (GLUCOPHAGE) 1000 MG tablet Take 1 tablet (1,000 mg total) by mouth 2 (two) times daily with a meal. 180 tablet 3  . Multiple Vitamin (MULTIVITAMIN) tablet Take 1 tablet by mouth daily.    . Semaglutide (RYBELSUS) 3 MG TABS Take 3 mg by mouth daily. 30 tablet 5  . tamsulosin (FLOMAX) 0.4 MG CAPS capsule TAKE 1 CAPSULE BY MOUTH EVERY DAY (Patient taking differently: Take by mouth as needed. ) 90 capsule 1  . traMADol (ULTRAM) 50 MG tablet Take 1-2 tablets every 8 hours as needed for moderate pain (Patient taking differently: Take by mouth as needed. ) 30 tablet 0  . insulin lispro (HUMALOG) 100 UNIT/ML injection Inject 0.05-0.1 mLs (5-10 Units total) into the skin daily. Use as needed for blood sugar >300 10 mL 11   No current facility-administered medications for this visit.    Allergies-reviewed and updated Allergies  Allergen Reactions  . Sitagliptin-Metformin Hcl Itching    Janumet  Social History   Socioeconomic History  . Marital status: Married    Spouse name: Not on file  . Number of children: 2  . Years of education: Not on file  . Highest education level: Not on file  Occupational History  . Not on file  Tobacco Use  . Smoking status: Never Smoker  . Smokeless tobacco: Never Used  Vaping Use  . Vaping Use: Never used  Substance and Sexual Activity  . Alcohol use: Yes    Alcohol/week: 1.0 standard drink    Types: 1 Cans of beer per week    Comment: occ  . Drug use: No  . Sexual activity: Yes    Partners: Female    Birth control/protection: Surgical  Other Topics Concern  . Not on file  Social History Narrative  . Not on file   Social Determinants of Health   Financial Resource Strain:   . Difficulty of Paying Living Expenses: Not on file  Food Insecurity:   . Worried About Charity fundraiser in the Last Year: Not on file  . Ran Out of Food in the Last Year: Not  on file  Transportation Needs:   . Lack of Transportation (Medical): Not on file  . Lack of Transportation (Non-Medical): Not on file  Physical Activity:   . Days of Exercise per Week: Not on file  . Minutes of Exercise per Session: Not on file  Stress:   . Feeling of Stress : Not on file  Social Connections:   . Frequency of Communication with Friends and Family: Not on file  . Frequency of Social Gatherings with Friends and Family: Not on file  . Attends Religious Services: Not on file  . Active Member of Clubs or Organizations: Not on file  . Attends Archivist Meetings: Not on file  . Marital Status: Not on file        Objective:  Physical Exam: BP (!) 173/89   Pulse 65   Temp 98.2 F (36.8 C) (Temporal)   Ht 6' (1.829 m)   Wt 290 lb 12.8 oz (131.9 kg)   SpO2 100%   BMI 39.44 kg/m   Body mass index is 39.44 kg/m. Wt Readings from Last 3 Encounters:  01/10/20 290 lb 12.8 oz (131.9 kg)  10/02/19 289 lb 6.4 oz (131.3 kg)  09/03/19 (!) 289 lb 6.4 oz (131.3 kg)   Gen: NAD, resting comfortably HEENT: TMs normal bilaterally. OP clear. No thyromegaly noted.  CV: RRR with no murmurs appreciated Pulm: NWOB, CTAB with no crackles, wheezes, or rhonchi GI: Normal bowel sounds present. Soft, Nontender, Nondistended. MSK: no edema, cyanosis, or clubbing noted.  Tinel's sign positive at left wrist -Left knee: No deformities.  No joint line tenderness.  Mild tenderness over patella.  Full range of motion.  Neurovascular intact distally.  Stable to varus and valgus stress.  Anterior posterior drawer signs negative. Skin: warm, dry Neuro: CN2-12 grossly intact. Strength 5/5 in upper and lower extremities. Reflexes symmetric and intact bilaterally.  Psych: Normal affect and thought content     Alan Payne M. Jerline Pain, MD 01/10/2020 8:36 AM

## 2020-01-10 NOTE — Assessment & Plan Note (Signed)
Check lipids.  Continue Lipitor 40 mg daily. °

## 2020-01-10 NOTE — Assessment & Plan Note (Signed)
Check B12 

## 2020-01-11 LAB — COMPREHENSIVE METABOLIC PANEL
AG Ratio: 1.4 (calc) (ref 1.0–2.5)
ALT: 14 U/L (ref 9–46)
AST: 13 U/L (ref 10–40)
Albumin: 3.9 g/dL (ref 3.6–5.1)
Alkaline phosphatase (APISO): 70 U/L (ref 36–130)
BUN: 11 mg/dL (ref 7–25)
CO2: 24 mmol/L (ref 20–32)
Calcium: 9.1 mg/dL (ref 8.6–10.3)
Chloride: 107 mmol/L (ref 98–110)
Creat: 0.98 mg/dL (ref 0.60–1.35)
Globulin: 2.7 g/dL (calc) (ref 1.9–3.7)
Glucose, Bld: 160 mg/dL — ABNORMAL HIGH (ref 65–99)
Potassium: 4.3 mmol/L (ref 3.5–5.3)
Sodium: 139 mmol/L (ref 135–146)
Total Bilirubin: 0.3 mg/dL (ref 0.2–1.2)
Total Protein: 6.6 g/dL (ref 6.1–8.1)

## 2020-01-11 LAB — LIPID PANEL
Cholesterol: 102 mg/dL (ref <200)
HDL: 22 mg/dL — ABNORMAL LOW (ref >=40)
LDL Cholesterol (Calc): 49 mg/dL (calc)
Non-HDL Cholesterol (Calc): 80 mg/dL (calc) (ref <130)
Total CHOL/HDL Ratio: 4.6 (calc) (ref <5.0)
Triglycerides: 260 mg/dL — ABNORMAL HIGH (ref <150)

## 2020-01-11 LAB — CBC
HCT: 43.4 % (ref 38.5–50.0)
Hemoglobin: 14.5 g/dL (ref 13.2–17.1)
MCH: 28.8 pg (ref 27.0–33.0)
MCHC: 33.4 g/dL (ref 32.0–36.0)
MCV: 86.1 fL (ref 80.0–100.0)
MPV: 9.7 fL (ref 7.5–12.5)
Platelets: 232 10*3/uL (ref 140–400)
RBC: 5.04 10*6/uL (ref 4.20–5.80)
RDW: 14.4 % (ref 11.0–15.0)
WBC: 8.3 10*3/uL (ref 3.8–10.8)

## 2020-01-11 LAB — HEMOGLOBIN A1C
Hgb A1c MFr Bld: 8 % of total Hgb — ABNORMAL HIGH (ref <5.7)
Mean Plasma Glucose: 183 (calc)
eAG (mmol/L): 10.1 (calc)

## 2020-01-11 LAB — PSA: PSA: 0.41 ng/mL (ref <=4.0)

## 2020-01-11 LAB — TSH: TSH: 2.08 mIU/L (ref 0.40–4.50)

## 2020-01-11 LAB — VITAMIN B12: Vitamin B-12: 308 pg/mL (ref 200–1100)

## 2020-01-14 NOTE — Progress Notes (Signed)
Please inform patient of the following:  A1c is still elevated but much better than last time. The rest of his blood work is all NORMAL.  Would like for him to switch from rybelsus to ozempic as we discussed at his office visit. Please send in prescription for 0.5mg  weekly. I would like to see him back in 3 months to recheck A1c.  Algis Greenhouse. Jerline Pain, MD 01/14/2020 8:49 AM

## 2020-01-15 ENCOUNTER — Other Ambulatory Visit: Payer: Self-pay | Admitting: *Deleted

## 2020-01-15 MED ORDER — OZEMPIC (0.25 OR 0.5 MG/DOSE) 2 MG/1.5ML ~~LOC~~ SOPN: 0.5000 mg | PEN_INJECTOR | SUBCUTANEOUS | 1 refills | Status: DC

## 2020-02-18 ENCOUNTER — Other Ambulatory Visit: Payer: Self-pay | Admitting: Family Medicine

## 2020-02-25 ENCOUNTER — Telehealth: Payer: Self-pay | Admitting: *Deleted

## 2020-02-25 NOTE — Telephone Encounter (Signed)
PA

## 2020-02-26 NOTE — Telephone Encounter (Signed)
Message from Plan CaseId:[id number];Status:Approved;Review Type:Prior Auth;Coverage Start Date:02/26/2020;Coverage End Date:02/25/2021; Pharmacy and patient notified

## 2020-02-26 NOTE — Telephone Encounter (Signed)
Alan Payne (Key: [id number]) Rx #: [id number] Ozempic (0.25 or 0.5 MG/DOSE) 2MG /1.5ML pen-injectors Awaiting for determination

## 2020-03-08 ENCOUNTER — Other Ambulatory Visit: Payer: Self-pay | Admitting: Family Medicine

## 2020-03-13 ENCOUNTER — Other Ambulatory Visit: Payer: Self-pay | Admitting: *Deleted

## 2020-03-13 MED ORDER — OZEMPIC (0.25 OR 0.5 MG/DOSE) 2 MG/1.5ML ~~LOC~~ SOPN: 0.5000 mg | PEN_INJECTOR | SUBCUTANEOUS | 1 refills | Status: DC

## 2020-04-24 ENCOUNTER — Other Ambulatory Visit: Payer: Self-pay | Admitting: Family Medicine

## 2020-05-27 ENCOUNTER — Emergency Department: Payer: Managed Care, Other (non HMO)

## 2020-05-27 ENCOUNTER — Emergency Department
Admission: EM | Admit: 2020-05-27 | Discharge: 2020-05-27 | Disposition: A | Discharge status: Home / Self Care | Payer: Managed Care, Other (non HMO) | Attending: Emergency Medicine | Admitting: Emergency Medicine

## 2020-05-27 ENCOUNTER — Other Ambulatory Visit: Payer: Self-pay

## 2020-05-27 DIAGNOSIS — R091 Pleurisy: Secondary | ICD-10-CM | POA: Insufficient documentation

## 2020-05-27 DIAGNOSIS — Z79899 Other long term (current) drug therapy: Secondary | ICD-10-CM | POA: Diagnosis not present

## 2020-05-27 DIAGNOSIS — I10 Essential (primary) hypertension: Secondary | ICD-10-CM | POA: Insufficient documentation

## 2020-05-27 DIAGNOSIS — E1169 Type 2 diabetes mellitus with other specified complication: Secondary | ICD-10-CM | POA: Insufficient documentation

## 2020-05-27 DIAGNOSIS — R0789 Other chest pain: Secondary | ICD-10-CM | POA: Diagnosis present

## 2020-05-27 DIAGNOSIS — Z794 Long term (current) use of insulin: Secondary | ICD-10-CM | POA: Insufficient documentation

## 2020-05-27 DIAGNOSIS — Z7984 Long term (current) use of oral hypoglycemic drugs: Secondary | ICD-10-CM | POA: Diagnosis not present

## 2020-05-27 DIAGNOSIS — E785 Hyperlipidemia, unspecified: Secondary | ICD-10-CM | POA: Diagnosis not present

## 2020-05-27 DIAGNOSIS — X500XXA Overexertion from strenuous movement or load, initial encounter: Secondary | ICD-10-CM | POA: Insufficient documentation

## 2020-05-27 LAB — TROPONIN I (HIGH SENSITIVITY)
Troponin I (High Sensitivity): 14 ng/L (ref <18)
Troponin I (High Sensitivity): 15 ng/L (ref <18)

## 2020-05-27 LAB — BASIC METABOLIC PANEL
Anion gap: 10 (ref 5–15)
BUN: 16 mg/dL (ref 6–20)
CO2: 20 mmol/L — ABNORMAL LOW (ref 22–32)
Calcium: 9.5 mg/dL (ref 8.9–10.3)
Chloride: 107 mmol/L (ref 98–111)
Creatinine, Ser: 1.17 mg/dL (ref 0.61–1.24)
GFR, Estimated: >60 mL/min (ref >60)
Glucose, Bld: 121 mg/dL — ABNORMAL HIGH (ref 70–99)
Potassium: 4.7 mmol/L (ref 3.5–5.1)
Sodium: 137 mmol/L (ref 135–145)

## 2020-05-27 LAB — CBC
HCT: 46.4 % (ref 39.0–52.0)
Hemoglobin: 15.3 g/dL (ref 13.0–17.0)
MCH: 28.3 pg (ref 26.0–34.0)
MCHC: 33 g/dL (ref 30.0–36.0)
MCV: 85.9 fL (ref 80.0–100.0)
Platelets: 229 10*3/uL (ref 150–400)
RBC: 5.4 MIL/uL (ref 4.22–5.81)
RDW: 14.3 % (ref 11.5–15.5)
WBC: 8.5 10*3/uL (ref 4.0–10.5)
nRBC: 0 % (ref 0.0–0.2)

## 2020-05-27 MED ORDER — NAPROXEN 375 MG PO TABS: 375.0000 mg | ORAL_TABLET | Freq: Two times a day (BID) | ORAL | 0 refills | Status: AC

## 2020-05-27 MED ORDER — LOSARTAN POTASSIUM 50 MG PO TABS
25.0000 mg | ORAL_TABLET | Freq: Once | ORAL | Status: AC
  Administered 2020-05-27: 25 mg via ORAL
  Filled 2020-05-27: qty 1

## 2020-05-27 MED ORDER — KETOROLAC TROMETHAMINE 60 MG/2ML IM SOLN
60.0000 mg | Freq: Once | INTRAMUSCULAR | Status: AC
  Administered 2020-05-27: 60 mg via INTRAMUSCULAR
  Filled 2020-05-27: qty 2

## 2020-05-27 NOTE — ED Triage Notes (Signed)
Pt comes with c/o left sided CP that started on Saturday. Pt states it started hurting in his left upper shoulder while working on his car. Pt states he didn't[t think much of it. Pt states it died off some. Pt states then yesterday it came back and he could feel it in his chest when he breathed in.  Pt denies any N/V/D or SOB.

## 2020-05-27 NOTE — Discharge Instructions (Addendum)
Take the Naproxen as prescribed No heavy lifting for 1 week Try gentle stretches to help

## 2020-05-27 NOTE — ED Provider Notes (Signed)
[facility] Emergency Department Provider Note  ____________________________________________   Event Date/Time   First MD Initiated Contact with Patient 05/27/20 [phone]     (approximate)  I have reviewed the triage vital signs and the nursing notes.   HISTORY  Chief Complaint Chest Pain    HPI Alan Payne. is a 50 y.o. male    here with left upper chest pain.  The patient states that over the weekend, he did somewhat more lifting than usual.  He lifted a tire to fix his car.  He reports that he initially felt some soreness in his upper chest and shoulders.  This improved throughout the day.  He had a fairly normal day thereafter, but yesterday, the pain returned as a somewhat sharp, pleuritic, left upper chest pain.  The pain is worse with deep inspiration.  Pain is worse with palpation.  No alleviating factors.  No history of DVT/PE.  Denies known history of coronary disease.  He does have history of diabetes.  Symptoms worse with deep inspiration but no worsening with exertion.  No nausea, shortness of breath, cough, diaphoresis.  No other complaints.       Past Medical History:  Diagnosis Date  . Acute renal failure (ARF) ([place]) 11/30/2017  . Bilateral ureteral calculi   . Constipation   . E-coli UTI 03/2013  . Gout   . Grade I diastolic dysfunction 66/07/3014   Noted on ECHO  . Hematuria 03/25/2012  . History of kidney stones 11/30/2017   Left renal stone  . Hydronephrosis, right   . Hypertension   . LVH (left ventricular hypertrophy) 11/27/2017   Moderate, noted on ECHO  . Morbid obesity ([place])   . OSA on CPAP   . Sinus tachycardia   . Sleep apnea   . Type 2 diabetes mellitus ([place])   . Wears glasses     Patient Active Problem List   Diagnosis Date Noted  . Dyslipidemia associated with type 2 diabetes mellitus ([place]) 01/10/2020  . B12 deficiency 09/03/2019  . Gout 11/30/2017  . Hypertension associated with diabetes ([place]) 11/26/2017   . History of kidney stones 02/19/2016  . OSA (obstructive sleep apnea) 02/19/2016  . Type 2 diabetes mellitus without complication, without long-term current use of insulin ([place]) 02/19/2016    Past Surgical History:  Procedure Laterality Date  . CIRCUMCISION  1993  . CYSTOSCOPY W/ URETERAL STENT PLACEMENT Left 11/25/2017   Procedure: CYSTOSCOPY WITH RETROGRADE PYELOGRAM/URETERAL STENT PLACEMENT;  Surgeon: Cleon Gustin, MD;  Location: [facility];  Service: Urology;  Laterality: Left;  . CYSTOSCOPY WITH RETROGRADE PYELOGRAM, URETEROSCOPY AND STENT PLACEMENT Left 12/11/2017   Procedure: CYSTOSCOPY WITH RETROGRADE PYELOGRAM, URETEROSCOPY AND STENT PLACEMENT;  Surgeon: Cleon Gustin, MD;  Location: [facility];  Service: Urology;  Laterality: Left;  1 HR  . EXTRACORPOREAL SHOCK WAVE LITHOTRIPSY Left 07-21-2014  . HOLMIUM LASER APPLICATION Left 01/0/9323   Procedure: HOLMIUM LASER APPLICATION;  Surgeon: Cleon Gustin, MD;  Location: [facility];  Service: Urology;  Laterality: Left;  Marland Kitchen VASECTOMY      Prior to Admission medications   Medication Sig Start Date End Date Taking? Authorizing Provider  naproxen (NAPROSYN) 375 MG tablet Take 1 tablet (375 mg total) by mouth 2 (two) times daily with a meal for 5 days. 05/27/20 06/01/20 Yes Duffy Bruce, MD  atorvastatin (LIPITOR) 40 MG tablet TAKE 1 TABLET BY MOUTH EVERY DAY 12/27/19   Vivi Barrack, MD  Ginkgo Biloba 40 MG TABS Take 1 tablet by mouth every evening.     [provider]  insulin lispro (HUMALOG) 100 UNIT/ML injection Inject 0.05-0.1 mLs (5-10 Units total) into the skin daily. Use as needed for blood sugar >300 01/10/20   Vivi Barrack, MD  losartan (COZAAR) 25 MG tablet TAKE 1 TABLET BY MOUTH EVERY DAY 03/09/20   Vivi Barrack, MD  metFORMIN (GLUCOPHAGE) 1000 MG tablet TAKE 1 TABLET (1,000 MG TOTAL) BY MOUTH 2 (TWO) TIMES DAILY WITH A MEAL. 04/26/20   Vivi Barrack, MD  Multiple  Vitamin (MULTIVITAMIN) tablet Take 1 tablet by mouth daily.    [provider]  Semaglutide,0.25 or 0.5MG /DOS, (OZEMPIC, 0.25 OR 0.5 MG/DOSE,) 2 MG/1.5ML SOPN Inject 0.5 mg into the skin once a week. 03/13/20   Vivi Barrack, MD  tamsulosin (FLOMAX) 0.4 MG CAPS capsule TAKE 1 CAPSULE BY MOUTH EVERY DAY Patient taking differently: Take by mouth as needed.  01/29/18   Vivi Barrack, MD  traMADol (ULTRAM) 50 MG tablet Take 1-2 tablets every 8 hours as needed for moderate pain Patient taking differently: Take by mouth as needed.  10/11/18   Vivi Barrack, MD    Allergies Sitagliptin-metformin hcl  Family History  Problem Relation Age of Onset  . Kidney cancer Father   . Hypertension Father   . Cancer Father        kidney  . Diabetes Mother   . Prostate cancer Neg Hx   . Colon cancer Neg Hx   . Rectal cancer Neg Hx   . Stomach cancer Neg Hx   . Esophageal cancer Neg Hx     Social History Social History   Tobacco Use  . Smoking status: Never Smoker  . Smokeless tobacco: Never Used  Vaping Use  . Vaping Use: Never used  Substance Use Topics  . Alcohol use: Yes    Alcohol/week: 1.0 standard drink    Types: 1 Cans of beer per week    Comment: occ  . Drug use: No    Review of Systems  Review of Systems  Constitutional: Negative for chills, fatigue and fever.  HENT: Negative for sore throat.   Respiratory: Negative for shortness of breath.   Cardiovascular: Positive for chest pain.  Gastrointestinal: Negative for abdominal pain.  Genitourinary: Negative for flank pain.  Musculoskeletal: Negative for neck pain.  Skin: Negative for rash and wound.  Allergic/Immunologic: Negative for immunocompromised state.  Neurological: Negative for weakness and numbness.  Hematological: Does not bruise/bleed easily.  All other systems reviewed and are negative.    ____________________________________________  PHYSICAL EXAM:      VITAL SIGNS: ED Triage Vitals  Enc Vitals  Group     BP 05/27/20 0857 (!) 162/101     Pulse Rate 05/27/20 0857 82     Resp 05/27/20 0857 17     Temp 05/27/20 0857 98.6 F (37 C)     Temp Source 05/27/20 0857 Oral     SpO2 05/27/20 0857 100 %     Weight --      Height --      Head Circumference --      Peak Flow --      Pain Score 05/27/20 0855 2     Pain Loc --      Pain Edu? --      Excl. in [place]? --      Physical Exam Vitals and nursing note reviewed.  Constitutional:  General: He is not in acute distress.    Appearance: He is well-developed.  HENT:     Head: Normocephalic and atraumatic.  Eyes:     Conjunctiva/sclera: Conjunctivae normal.  Cardiovascular:     Rate and Rhythm: Normal rate and regular rhythm.     Heart sounds: Normal heart sounds.  Pulmonary:     Effort: Pulmonary effort is normal. No respiratory distress.     Breath sounds: No wheezing.  Abdominal:     General: There is no distension.  Musculoskeletal:     Cervical back: Neck supple.  Skin:    General: Skin is warm.     Capillary Refill: Capillary refill takes less than 2 seconds.     Findings: No rash.  Neurological:     Mental Status: He is alert and oriented to person, place, and time.     Motor: No abnormal muscle tone.       ____________________________________________   LABS (all labs ordered are listed, but only abnormal results are displayed)  Labs Reviewed  BASIC METABOLIC PANEL - Abnormal; Notable for the following components:      Result Value   CO2 20 (*)    Glucose, Bld 121 (*)    All other components within normal limits  CBC  TROPONIN I (HIGH SENSITIVITY)  TROPONIN I (HIGH SENSITIVITY)    ____________________________________________  EKG: Normal sinus rhythm, ventricular rate 85.  PR 140, QRS 85, QTc 430.  Borderline T wave abnormalities.  No acute ST elevations or depressions.  EKG evidence of acute ischemia or infarct. ________________________________________  RADIOLOGY All imaging, including plain  films, CT scans, and ultrasounds, independently reviewed by me, and interpretations confirmed via formal radiology reads.  ED MD interpretation:   Chest x-ray: Negative  Official radiology report(s): DG Chest 2 View  Result Date: 05/27/2020 CLINICAL DATA:  Chest pain. EXAM: CHEST - 2 VIEW COMPARISON:  None. FINDINGS: The heart size and mediastinal contours are within normal limits. Both lungs are clear. No pneumothorax or pleural effusion is noted. The visualized skeletal structures are unremarkable. IMPRESSION: No active cardiopulmonary disease. Electronically Signed   By: Marijo Conception M.D.   On: 05/27/2020 10:02    ____________________________________________  PROCEDURES   Procedure(s) performed (including Critical Care):  Procedures  ____________________________________________  INITIAL IMPRESSION / MDM / [place] / ED COURSE  As part of my medical decision making, I reviewed the following data within the [place] notes reviewed and incorporated, Old chart reviewed, Notes from prior ED visits, and [place]. was evaluated in Emergency Department on 05/27/2020 for the symptoms described in the history of present illness. He was evaluated in the context of the global COVID-19 pandemic, which necessitated consideration that the patient might be at risk for infection with the SARS-CoV-2 virus that causes COVID-19. Institutional protocols and algorithms that pertain to the evaluation of patients at risk for COVID-19 are in a state of rapid change based on information released by regulatory bodies including the CDC and federal and state organizations. These policies and algorithms were followed during the patient's care in the ED.  Some ED evaluations and interventions may be delayed as a result of limited staffing during the pandemic.*     Medical Decision Making: 50 year old very pleasant male  here with left upper chest pain.  I suspect musculoskeletal chest pain in the setting of recent lifting over the weekend.  Pain is reproducible and somewhat positional.  He does have a pleuritic component but I suspect this is due to intercostal sprain, patient has no cough, shortness of breath, hypoxia, tachypnea, and is PERC negative, do not suspect PE.  Pain is not consistent with dissection.  EKG is nonischemic.  Troponin is negative x2, do not suspect ACS at this time.  Otherwise, lab work reviewed and is unremarkable.  Normal CBC and BMP.  Will discharge with low-dose NSAIDs, treatment for musculoskeletal chest pain, and return precautions.  ____________________________________________  FINAL CLINICAL IMPRESSION(S) / ED DIAGNOSES  Final diagnoses:  Musculoskeletal chest pain  Atypical chest pain     MEDICATIONS GIVEN DURING THIS VISIT:  Medications  losartan (COZAAR) tablet 25 mg (25 mg Oral Given 05/27/20 1033)  ketorolac (TORADOL) injection 60 mg (60 mg Intramuscular Given 05/27/20 1030)     ED Discharge Orders         Ordered    naproxen (NAPROSYN) 375 MG tablet  2 times daily with meals        05/27/20 1157           Note:  This document was prepared using Dragon voice recognition software and may include unintentional dictation errors.   Duffy Bruce, MD 05/27/20 1201

## 2020-07-13 ENCOUNTER — Other Ambulatory Visit: Payer: Self-pay | Admitting: *Deleted

## 2020-07-13 ENCOUNTER — Encounter: Payer: Self-pay | Admitting: Family Medicine

## 2020-07-13 ENCOUNTER — Ambulatory Visit: Payer: Managed Care, Other (non HMO) | Admitting: Family Medicine

## 2020-07-13 ENCOUNTER — Other Ambulatory Visit: Payer: Self-pay

## 2020-07-13 VITALS — BP 152/100 | HR 70 | Temp 97.8°F | Ht 72.0 in | Wt 293.0 lb

## 2020-07-13 DIAGNOSIS — E1159 Type 2 diabetes mellitus with other circulatory complications: Secondary | ICD-10-CM | POA: Diagnosis not present

## 2020-07-13 DIAGNOSIS — R7989 Other specified abnormal findings of blood chemistry: Secondary | ICD-10-CM

## 2020-07-13 DIAGNOSIS — E119 Type 2 diabetes mellitus without complications: Secondary | ICD-10-CM

## 2020-07-13 DIAGNOSIS — R6882 Decreased libido: Secondary | ICD-10-CM

## 2020-07-13 DIAGNOSIS — E1169 Type 2 diabetes mellitus with other specified complication: Secondary | ICD-10-CM | POA: Diagnosis not present

## 2020-07-13 DIAGNOSIS — E785 Hyperlipidemia, unspecified: Secondary | ICD-10-CM

## 2020-07-13 DIAGNOSIS — I152 Hypertension secondary to endocrine disorders: Secondary | ICD-10-CM

## 2020-07-13 LAB — POCT GLYCOSYLATED HEMOGLOBIN (HGB A1C): Hemoglobin A1C: 7 % — AB (ref 4.0–5.6)

## 2020-07-13 LAB — TESTOSTERONE: Testosterone: 172.49 ng/dL — ABNORMAL LOW (ref 300.00–890.00)

## 2020-07-13 NOTE — Assessment & Plan Note (Signed)
A1c 7.0.  He is doing well with Ozempic 0.5 mg weekly and metformin 1000 mg twice daily.  He will continue working on lifestyle modifications.  He has done a great job with these.  Follow-up in 6 months for CPE and recheck A1c at that time.

## 2020-07-13 NOTE — Progress Notes (Signed)
   Alan Payne. is a 50 y.o. male who presents today for an office visit.  Assessment/Plan:  New/Acute Problems: Decreased libido We will check testosterone.  Has no issues with achieving erection though will sometimes lose erection during intercourse.  Depending on results may need referral to urology versus trial of medication such as Viagra or Cialis.  Chronic Problems Addressed Today: Hypertension associated with diabetes ([place]) Slightly above goal.  Well-controlled at home and is previously well controlled.  Continue current dose of losartan 25 mg daily and home monitoring.  Type 2 diabetes mellitus without complication, without long-term current use of insulin ([facility]) A1c 7.0.  He is doing well with Ozempic 0.5 mg weekly and metformin 1000 mg twice daily.  He will continue working on lifestyle modifications.  He has done a great job with these.  Follow-up in 6 months for CPE and recheck A1c at that time.  Dyslipidemia associated with type 2 diabetes mellitus ([facility]) Last LDL 49.  Continue atorvastatin 40 mg daily.      Subjective:  HPI:  See A/p.         Objective:  Physical Exam: BP (!) 152/100   Pulse 70   Temp 97.8 F (36.6 C) (Temporal)   Ht 6' (1.829 m)   Wt 293 lb (132.9 kg)   SpO2 100%   BMI 39.74 kg/m   Wt Readings from Last 3 Encounters:  07/13/20 293 lb (132.9 kg)  01/10/20 290 lb 12.8 oz (131.9 kg)  10/02/19 289 lb 6.4 oz (131.3 kg)   acute distress, resting comfortably CV: Regular rate and rhythm with no murmurs appreciated Pulm: Normal work of breathing, clear to auscultation bilaterally with no crackles, wheezes, or rhonchi Neuro: Grossly normal, moves all extremities Psych: Normal affect and thought content      Vernis [place]acungan M. Jerline Pain, MD 07/13/2020 8:31 AM

## 2020-07-13 NOTE — Assessment & Plan Note (Signed)
Slightly above goal.  Well-controlled at home and is previously well controlled.  Continue current dose of losartan 25 mg daily and home monitoring.

## 2020-07-13 NOTE — Assessment & Plan Note (Signed)
Last LDL 49.  Continue atorvastatin 40 mg daily.

## 2020-07-13 NOTE — Progress Notes (Signed)
Please inform patient of the following:  His testosterone level is low. Please have him come back for a repeat value.  Algis Greenhouse. Jerline Pain, MD 07/13/2020 1:02 PM

## 2020-07-13 NOTE — Patient Instructions (Signed)
It was very nice to see you today!  Your A1c looks great!  Please keep up the good work.  We will check your testosterone level today.  I will see you back in 6 months for your annual physical with labs.  Come back to see me sooner if needed.  Take care, Dr Jerline Pain  PLEASE NOTE:  If you had any lab tests please let us know if you have not heard back within a few days. You may see your results on mychart before we have a chance to review them but we will give you a call once they are reviewed by [place]. If we ordered any referrals today, please let us know if you have not heard from their office within the next week.   Please try these tips to maintain a healthy lifestyle:   Eat at least 3 REAL meals and 1-2 snacks per day.  Aim for no more than 5 hours between eating.  If you eat breakfast, please do so within one hour of getting up.    Each meal should contain half fruits/vegetables, one quarter protein, and one quarter carbs (no bigger than a computer mouse)   Cut down on sweet beverages. This includes juice, soda, and sweet tea.     Drink at least 1 glass of water with each meal and aim for at least 8 glasses per day   Exercise at least 150 minutes every week.

## 2020-07-18 ENCOUNTER — Other Ambulatory Visit: Payer: Self-pay | Admitting: Family Medicine

## 2020-07-20 ENCOUNTER — Other Ambulatory Visit: Payer: Managed Care, Other (non HMO)

## 2020-07-22 ENCOUNTER — Other Ambulatory Visit: Payer: Self-pay

## 2020-07-22 ENCOUNTER — Other Ambulatory Visit (INDEPENDENT_AMBULATORY_CARE_PROVIDER_SITE_OTHER): Payer: Managed Care, Other (non HMO)

## 2020-07-22 DIAGNOSIS — R7989 Other specified abnormal findings of blood chemistry: Secondary | ICD-10-CM

## 2020-07-22 LAB — TESTOSTERONE: Testosterone: 160.21 ng/dL — ABNORMAL LOW (ref 300.00–890.00)

## 2020-07-22 NOTE — Progress Notes (Signed)
Please inform patient of the following:  Testosterone level low again. Recommend referral to urology to discuss replacement if he is interested.  Algis Greenhouse. Jerline Pain, MD 07/22/2020 10:54 AM

## 2020-09-09 ENCOUNTER — Other Ambulatory Visit: Payer: Self-pay | Admitting: Family Medicine

## 2020-10-28 ENCOUNTER — Other Ambulatory Visit: Payer: Self-pay | Admitting: Family Medicine

## 2020-12-09 ENCOUNTER — Telehealth: Payer: Self-pay | Admitting: Family Medicine

## 2020-12-09 NOTE — Telephone Encounter (Signed)
Please have him schedule a visit. We cannot send in controlled rx without a visit. This was last filled 1 years ago.

## 2020-12-09 NOTE — Telephone Encounter (Signed)
Patient is scheduled for a virtual with Alan Payne tomorrow, wanted to know how much asprin or tylenol to take for the pain until he can see someone.

## 2020-12-10 ENCOUNTER — Telehealth (INDEPENDENT_AMBULATORY_CARE_PROVIDER_SITE_OTHER): Payer: Managed Care, Other (non HMO) | Admitting: Physician Assistant

## 2020-12-10 VITALS — Ht 72.0 in

## 2020-12-10 DIAGNOSIS — N200 Calculus of kidney: Secondary | ICD-10-CM

## 2020-12-10 DIAGNOSIS — M109 Gout, unspecified: Secondary | ICD-10-CM

## 2020-12-10 DIAGNOSIS — Z87442 Personal history of urinary calculi: Secondary | ICD-10-CM

## 2020-12-10 MED ORDER — TRAMADOL HCL 50 MG PO TABS: ORAL_TABLET | ORAL | 0 refills | Status: AC

## 2020-12-10 MED ORDER — ONDANSETRON 8 MG PO TBDP: 8.0000 mg | ORAL_TABLET | Freq: Three times a day (TID) | ORAL | 0 refills | Status: AC | PRN

## 2020-12-10 NOTE — Patient Instructions (Signed)
Good to meet you today! Please follow-up with Dr. Jerline Pain next month and have a uric acid level drawn.  Consider starting on allopurinol.  Continue to push fluids and present to the emergency department if acutely worsening symptoms.

## 2020-12-10 NOTE — Progress Notes (Signed)
Virtual Visit via Video Note  I connected with  Alan Payne.  on 12/10/20 at  7:30 AM EDT by a video enabled telemedicine application and verified that I am speaking with the correct person using two identifiers.  Location: Patient: home Provider: Therapist, music at [place] present: Patient and myself   I discussed the limitations of evaluation and management by telemedicine and the availability of in person appointments. The patient expressed understanding and agreed to proceed.   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    Observations/Objective:   Gen: Awake, alert, no acute distress; appears tired Resp: Breathing is even and non-labored Psych: calm/pleasant demeanor Neuro: Alert and Oriented x 3, + facial symmetry, speech is clear.   Assessment and Plan:  1. History of kidney stones 2. Gout, unspecified cause, unspecified chronicity, unspecified site -I personally reviewed his records in regards to his history of stones. He has seen urology and his stones are 100% uric acid. -Last uric acid level was 8.9 on 11/23/2017 -I encouraged him to talk with Dr. Jerline Pain about allopurinol again and rechecking his uric acid level when he comes back for CPE next month. -PDMP reviewed today, no red flags, filling appropriately. Tramadol 50 mg #30 sent for him today. -He knows red flags when to go to the  ED if any worsening symptoms.   Follow Up Instructions:    I discussed the assessment and treatment plan with the patient. The patient was provided an opportunity to ask questions and all were answered. The patient agreed with the plan and demonstrated an understanding of the instructions.   The patient was advised to call back or seek an in-person evaluation if the symptoms worsen or if the condition fails to improve as anticipated.  Marionna Gonia M Jarae Panas, PA-C

## 2021-01-07 ENCOUNTER — Other Ambulatory Visit: Payer: Self-pay | Admitting: Family Medicine

## 2021-01-15 ENCOUNTER — Other Ambulatory Visit: Payer: Self-pay

## 2021-01-15 ENCOUNTER — Ambulatory Visit (INDEPENDENT_AMBULATORY_CARE_PROVIDER_SITE_OTHER): Payer: Managed Care, Other (non HMO) | Admitting: Family Medicine

## 2021-01-15 ENCOUNTER — Encounter: Payer: Self-pay | Admitting: Family Medicine

## 2021-01-15 VITALS — BP 148/88 | HR 87 | Temp 97.6°F | Ht 72.0 in | Wt 294.6 lb

## 2021-01-15 DIAGNOSIS — Z6839 Body mass index (BMI) 39.0-39.9, adult: Secondary | ICD-10-CM

## 2021-01-15 DIAGNOSIS — E119 Type 2 diabetes mellitus without complications: Secondary | ICD-10-CM | POA: Diagnosis not present

## 2021-01-15 DIAGNOSIS — E1169 Type 2 diabetes mellitus with other specified complication: Secondary | ICD-10-CM

## 2021-01-15 DIAGNOSIS — M109 Gout, unspecified: Secondary | ICD-10-CM

## 2021-01-15 DIAGNOSIS — E538 Deficiency of other specified B group vitamins: Secondary | ICD-10-CM

## 2021-01-15 DIAGNOSIS — Z0001 Encounter for general adult medical examination with abnormal findings: Secondary | ICD-10-CM

## 2021-01-15 DIAGNOSIS — E1159 Type 2 diabetes mellitus with other circulatory complications: Secondary | ICD-10-CM | POA: Diagnosis not present

## 2021-01-15 DIAGNOSIS — Z125 Encounter for screening for malignant neoplasm of prostate: Secondary | ICD-10-CM

## 2021-01-15 DIAGNOSIS — E785 Hyperlipidemia, unspecified: Secondary | ICD-10-CM | POA: Diagnosis not present

## 2021-01-15 DIAGNOSIS — E669 Obesity, unspecified: Secondary | ICD-10-CM

## 2021-01-15 DIAGNOSIS — I152 Hypertension secondary to endocrine disorders: Secondary | ICD-10-CM

## 2021-01-15 LAB — COMPREHENSIVE METABOLIC PANEL
ALT: 24 U/L (ref 0–53)
AST: 15 U/L (ref 0–37)
Albumin: 3.9 g/dL (ref 3.5–5.2)
Alkaline Phosphatase: 76 U/L (ref 39–117)
BUN: 12 mg/dL (ref 6–23)
CO2: 25 mEq/L (ref 19–32)
Calcium: 9.2 mg/dL (ref 8.4–10.5)
Chloride: 107 mEq/L (ref 96–112)
Creatinine, Ser: 1.26 mg/dL (ref 0.40–1.50)
GFR: 66.61 mL/min (ref >60.00)
Glucose, Bld: 170 mg/dL — ABNORMAL HIGH (ref 70–99)
Potassium: 4.3 mEq/L (ref 3.5–5.1)
Sodium: 139 mEq/L (ref 135–145)
Total Bilirubin: 0.4 mg/dL (ref 0.2–1.2)
Total Protein: 6.6 g/dL (ref 6.0–8.3)

## 2021-01-15 LAB — LIPID PANEL
Cholesterol: 98 mg/dL (ref 0–200)
HDL: 24.6 mg/dL — ABNORMAL LOW (ref >39.00)
LDL Cholesterol: 37 mg/dL (ref 0–99)
NonHDL: 73.77
Total CHOL/HDL Ratio: 4
Triglycerides: 183 mg/dL — ABNORMAL HIGH (ref 0.0–149.0)
VLDL: 36.6 mg/dL (ref 0.0–40.0)

## 2021-01-15 LAB — CBC
HCT: 42 % (ref 39.0–52.0)
Hemoglobin: 14.1 g/dL (ref 13.0–17.0)
MCHC: 33.6 g/dL (ref 30.0–36.0)
MCV: 87.2 fl (ref 78.0–100.0)
Platelets: 217 10*3/uL (ref 150.0–400.0)
RBC: 4.82 Mil/uL (ref 4.22–5.81)
RDW: 13.8 % (ref 11.5–15.5)
WBC: 7.6 10*3/uL (ref 4.0–10.5)

## 2021-01-15 LAB — PSA: PSA: 0.37 ng/mL (ref 0.10–4.00)

## 2021-01-15 LAB — TSH: TSH: 1.21 u[IU]/mL (ref 0.35–5.50)

## 2021-01-15 LAB — HEMOGLOBIN A1C: Hgb A1c MFr Bld: 8.7 % — ABNORMAL HIGH (ref 4.6–6.5)

## 2021-01-15 NOTE — Assessment & Plan Note (Signed)
Check uric acid level 

## 2021-01-15 NOTE — Assessment & Plan Note (Signed)
Check B12 

## 2021-01-15 NOTE — Assessment & Plan Note (Signed)
Slightly above goal.  Typically well controlled.  Continue losartan 25 mg daily.  Continue home monitoring goal 140/90 or lower.  Check labs today.

## 2021-01-15 NOTE — Patient Instructions (Signed)
It was very nice to see you today!  We will check blood work today.  Please continue working on diet and exercise.  We can probably increase your dose of Ozempic and work on weaning off your dose of metformin depending on the results of your blood work.  We will see you back in 3 to 6 months to recheck your A1c depending on the results of your blood work.  I will see back in year for your next physical.  Please come back to see me sooner if needed.  Take care, Dr Jerline Pain  PLEASE NOTE:  If you had any lab tests please let us know if you have not heard back within a few days. You may see your results on mychart before we have a chance to review them but we will give you a call once they are reviewed by [place]. If we ordered any referrals today, please let us know if you have not heard from their office within the next week.   Please try these tips to maintain a healthy lifestyle:  Eat at least 3 REAL meals and 1-2 snacks per day.  Aim for no more than 5 hours between eating.  If you eat breakfast, please do so within one hour of getting up.   Each meal should contain half fruits/vegetables, one quarter protein, and one quarter carbs (no bigger than a computer mouse)  Cut down on sweet beverages. This includes juice, soda, and sweet tea.   Drink at least 1 glass of water with each meal and aim for at least 8 glasses per day  Exercise at least 150 minutes every week.    Preventive Care 31-56 Years Old, Male Preventive care refers to lifestyle choices and visits with your health care provider that can promote health and wellness. Preventive care visits are also called wellness exams. What can I expect for my preventive care visit? Counseling During your preventive care visit, your health care provider may ask about your: Medical history, including: Past medical problems. Family medical history. Current health, including: Emotional well-being. Home life and relationship  well-being. Sexual activity. Lifestyle, including: Alcohol, nicotine or tobacco, and drug use. Access to firearms. Diet, exercise, and sleep habits. Safety issues such as seatbelt and bike helmet use. Sunscreen use. Work and work Statistician. Physical exam Your health care provider will check your: Height and weight. These may be used to calculate your BMI (body mass index). BMI is a measurement that tells if you are at a healthy weight. Waist circumference. This measures the distance around your waistline. This measurement also tells if you are at a healthy weight and may help predict your risk of certain diseases, such as type 2 diabetes and high blood pressure. Heart rate and blood pressure. Body temperature. Skin for abnormal spots. What immunizations do I need? Vaccines are usually given at various ages, according to a schedule. Your health care provider will recommend vaccines for you based on your age, medical history, and lifestyle or other factors, such as travel or where you work. What tests do I need? Screening Your health care provider may recommend screening tests for certain conditions. This may include: Lipid and cholesterol levels. Diabetes screening. This is done by checking your blood sugar (glucose) after you have not eaten for a while (fasting). Hepatitis B test. Hepatitis C test. HIV (human immunodeficiency virus) test. STI (sexually transmitted infection) testing, if you are at risk. Lung cancer screening. Prostate cancer screening. Colorectal cancer screening. Talk with your  health care provider about your test results, treatment options, and if necessary, the need for more tests. Follow these instructions at home: Eating and drinking  Eat a diet that includes fresh fruits and vegetables, whole grains, lean protein, and low-fat dairy products. Take vitamin and mineral supplements as recommended by your health care provider. Do not drink alcohol if your  health care provider tells you not to drink. If you drink alcohol: Limit how much you have to 0-2 drinks a day. Know how much alcohol is in your drink. In the [place] one drink equals one 12 oz bottle of beer (355 mL), one 5 oz glass of wine (148 mL), or one 1 oz glass of hard liquor (44 mL). Lifestyle Brush your teeth every morning and night with fluoride toothpaste. Floss one time each day. Exercise for at least 30 minutes 5 or more days each week. Do not use any products that contain nicotine or tobacco. These products include cigarettes, chewing tobacco, and vaping devices, such as e-cigarettes. If you need help quitting, ask your health care provider. Do not use drugs. If you are sexually active, practice safe sex. Use a condom or other form of protection to prevent STIs. Take aspirin only as told by your health care provider. Make sure that you understand how much to take and what form to take. Work with your health care provider to find out whether it is safe and beneficial for you to take aspirin daily. Find healthy ways to manage stress, such as: Meditation, yoga, or listening to music. Journaling. Talking to a trusted person. Spending time with friends and family. Minimize exposure to UV radiation to reduce your risk of skin cancer. Safety Always wear your seat belt while driving or riding in a vehicle. Do not drive: If you have been drinking alcohol. Do not ride with someone who has been drinking. When you are tired or distracted. While texting. If you have been using any mind-altering substances or drugs. Wear a helmet and other protective equipment during sports activities. If you have firearms in your house, make sure you follow all gun safety procedures. What's next? Go to your health care provider once a year for an annual wellness visit. Ask your health care provider how often you should have your eyes and teeth checked. Stay up to date on all vaccines. This information  is not intended to replace advice given to you by your health care provider. Make sure you discuss any questions you have with your health care provider. Document Revised: 07/22/2020 Document Reviewed: 07/22/2020 Elsevier Patient Education  [place].

## 2021-01-15 NOTE — Progress Notes (Signed)
Chief Complaint:  Alan Payne. is a 50 y.o. male who presents today for his annual comprehensive physical exam.    Assessment/Plan:  Chronic Problems Addressed Today: Dyslipidemia associated with type 2 diabetes mellitus ([place]) On Lipitor 40 mg daily.  Check labs today.  B12 deficiency Check B12.   Gout Check uric acid level.  Hypertension associated with diabetes ([place]) Slightly above goal.  Typically well controlled.  Continue losartan 25 mg daily.  Continue home monitoring goal 140/90 or lower.  Check labs today.  Type 2 diabetes mellitus without complication, without long-term current use of insulin ([facility]) Check A1c.  Discussed lifestyle modifications.  He is worried about potential side effects of metformin.  We will continue current regimen 1000 mg twice daily of metformin and Ozempic 0.5 mg weekly.  Depending on results of A1c we could consider increasing dose of Ozempic and weaning down on dose of metformin.  We will follow-up in 3 to 6 months.   Body mass index is 39.95 kg/m. / Obese  BMI Metric Follow Up - 01/15/21 0854       BMI Metric Follow Up-Please document annually   BMI Metric Follow Up Education provided              Preventative Healthcare: Up-to-date on colon cancer screening.  We will check labs today.  Has already received flu vaccine.  He will get eye exam soon.  Patient Counseling(The following topics were reviewed and/or handout was given):  -Nutrition: Stressed importance of moderation in sodium/caffeine intake, saturated fat and cholesterol, caloric balance, sufficient intake of fresh fruits, vegetables, and fiber.  -Stressed the importance of regular exercise.   -Substance Abuse: Discussed cessation/primary prevention of tobacco, alcohol, or other drug use; driving or other dangerous activities under the influence; availability of treatment for abuse.   -Injury prevention: Discussed safety belts, safety helmets, smoke detector, smoking  near bedding or upholstery.   -Sexuality: Discussed sexually transmitted diseases, partner selection, use of condoms, avoidance of unintended pregnancy and contraceptive alternatives.   -Dental health: Discussed importance of regular tooth brushing, flossing, and dental visits.  -Health maintenance and immunizations reviewed. Please refer to Health maintenance section.  Return to care in 1 year for next preventative visit.     Subjective:  HPI:  He has no acute complaints today. See A/p for status of chronic conditions.   Lifestyle Diet: None specific.  Exercise: Working on treadmill a few times per week.   Depression screen PHQ 2/9 01/15/2021  Decreased Interest 0  Down, Depressed, Hopeless 0  PHQ - 2 Score 0    Health Maintenance Due  Topic Date Due   Hepatitis C Screening  Never done   COVID-19 Vaccine (4 - Booster for Pfizer series) 04/24/2020   OPHTHALMOLOGY EXAM  05/02/2020   Zoster Vaccines- Shingrix (1 of 2) Never done   HEMOGLOBIN A1C  01/12/2021     ROS: Per HPI, otherwise a complete review of systems was negative.   PMH:  The following were reviewed and entered/updated in epic: Past Medical History:  Diagnosis Date   Acute renal failure (ARF) ([place]) 11/30/2017   Bilateral ureteral calculi    Constipation    E-coli UTI 03/2013   Gout    Grade I diastolic dysfunction 41/74/0814   Noted on ECHO   Hematuria 03/25/2012   History of kidney stones 11/30/2017   Left renal stone   Hydronephrosis, right    Hypertension    LVH (left ventricular hypertrophy) 11/27/2017  Moderate, noted on ECHO   Morbid obesity ([place])    OSA on CPAP    Sinus tachycardia    Sleep apnea    Type 2 diabetes mellitus ([place])    Wears glasses    Patient Active Problem List   Diagnosis Date Noted   Dyslipidemia associated with type 2 diabetes mellitus ([place]) 01/10/2020   B12 deficiency 09/03/2019   Gout 11/30/2017   Hypertension associated with diabetes ([place]) 11/26/2017   History  of kidney stones 02/19/2016   OSA (obstructive sleep apnea) 02/19/2016   Type 2 diabetes mellitus without complication, without long-term current use of insulin ([place]) 02/19/2016   Past Surgical History:  Procedure Laterality Date   CIRCUMCISION  1993   CYSTOSCOPY W/ URETERAL STENT PLACEMENT Left 11/25/2017   Procedure: CYSTOSCOPY WITH RETROGRADE PYELOGRAM/URETERAL STENT PLACEMENT;  Surgeon: Cleon Gustin, MD;  Location: [facility];  Service: Urology;  Laterality: Left;   CYSTOSCOPY WITH RETROGRADE PYELOGRAM, URETEROSCOPY AND STENT PLACEMENT Left 12/11/2017   Procedure: CYSTOSCOPY WITH RETROGRADE PYELOGRAM, URETEROSCOPY AND STENT PLACEMENT;  Surgeon: Cleon Gustin, MD;  Location: [facility];  Service: Urology;  Laterality: Left;  1 HR   EXTRACORPOREAL SHOCK WAVE LITHOTRIPSY Left 07-21-2014   HOLMIUM LASER APPLICATION Left 62/09/3149   Procedure: HOLMIUM LASER APPLICATION;  Surgeon: Cleon Gustin, MD;  Location: [facility];  Service: Urology;  Laterality: Left;   VASECTOMY      Family History  Problem Relation Age of Onset   Kidney cancer Father    Hypertension Father    Cancer Father        kidney   Diabetes Mother    Prostate cancer Neg Hx    Colon cancer Neg Hx    Rectal cancer Neg Hx    Stomach cancer Neg Hx    Esophageal cancer Neg Hx     Medications- reviewed and updated Current Outpatient Medications  Medication Sig Dispense Refill   atorvastatin (LIPITOR) 40 MG tablet TAKE 1 TABLET BY MOUTH EVERY DAY 90 tablet 2   Ginkgo Biloba 40 MG TABS Take 1 tablet by mouth every evening.      insulin lispro (HUMALOG) 100 UNIT/ML injection Inject 0.05-0.1 mLs (5-10 Units total) into the skin daily. Use as needed for blood sugar >300 10 mL 11   losartan (COZAAR) 25 MG tablet TAKE 1 TABLET BY MOUTH EVERY DAY 90 tablet 1   metFORMIN (GLUCOPHAGE) 1000 MG tablet TAKE 1 TABLET (1,000 MG TOTAL) BY MOUTH 2 (TWO) TIMES DAILY WITH A MEAL. 180 tablet 3    Multiple Vitamin (MULTIVITAMIN) tablet Take 1 tablet by mouth daily.     ondansetron (ZOFRAN-ODT) 8 MG disintegrating tablet Take 1 tablet (8 mg total) by mouth every 8 (eight) hours as needed for nausea or vomiting. 20 tablet 0   OZEMPIC, 0.25 OR 0.5 MG/DOSE, 2 MG/1.5ML SOPN INJECT 0.5 MG INTO THE SKIN ONCE A WEEK. 1.5 mL 3   tamsulosin (FLOMAX) 0.4 MG CAPS capsule TAKE 1 CAPSULE BY MOUTH EVERY DAY (Patient taking differently: Take by mouth as needed.) 90 capsule 1   traMADol (ULTRAM) 50 MG tablet Take 1-2 tablets every 8 hours as needed for moderate pain 30 tablet 0   No current facility-administered medications for this visit.    Allergies-reviewed and updated Allergies  Allergen Reactions   Nsaids     Renal problems   Sitagliptin-Metformin Hcl Itching    Janumet    Social History   Socioeconomic History   Marital  status: Married    Spouse name: Not on file   Number of children: 2   Years of education: Not on file   Highest education level: Not on file  Occupational History   Not on file  Tobacco Use   Smoking status: Never   Smokeless tobacco: Never  Vaping Use   Vaping Use: Never used  Substance and Sexual Activity   Alcohol use: Yes    Alcohol/week: 1.0 standard drink    Types: 1 Cans of beer per week    Comment: occ   Drug use: No   Sexual activity: Yes    Partners: Female    Birth control/protection: Surgical  Other Topics Concern   Not on file  Social History Narrative   Not on file   Social Determinants of Health   Financial Resource Strain: Not on file  Food Insecurity: Not on file  Transportation Needs: Not on file  Physical Activity: Not on file  Stress: Not on file  Social Connections: Not on file        Objective:  Physical Exam: BP (!) 148/88 (BP Location: Left Arm, Patient Position: Sitting, Cuff Size: Large)   Pulse 87   Temp 97.6 F (36.4 C) (Temporal)   Ht 6' (1.829 m)   Wt 294 lb 9.6 oz (133.6 kg)   SpO2 98%   BMI 39.95 kg/m    Body mass index is 39.95 kg/m. Wt Readings from Last 3 Encounters:  01/15/21 294 lb 9.6 oz (133.6 kg)  07/13/20 293 lb (132.9 kg)  01/10/20 290 lb 12.8 oz (131.9 kg)   Gen: NAD, resting comfortably HEENT: TMs normal bilaterally. OP clear. No thyromegaly noted.  CV: RRR with no murmurs appreciated Pulm: NWOB, CTAB with no crackles, wheezes, or rhonchi GI: Normal bowel sounds present. Soft, Nontender, Nondistended. MSK: no edema, cyanosis, or clubbing noted Skin: warm, dry Neuro: CN2-12 grossly intact. Strength 5/5 in upper and lower extremities. Reflexes symmetric and intact bilaterally.  Psych: Normal affect and thought content     Hartford Maulden M. Jerline Pain, MD 01/15/2021 8:54 AM

## 2021-01-15 NOTE — Assessment & Plan Note (Signed)
On Lipitor 40 mg daily.  Check labs today.

## 2021-01-15 NOTE — Assessment & Plan Note (Signed)
Check A1c.  Discussed lifestyle modifications.  He is worried about potential side effects of metformin.  We will continue current regimen 1000 mg twice daily of metformin and Ozempic 0.5 mg weekly.  Depending on results of A1c we could consider increasing dose of Ozempic and weaning down on dose of metformin.  We will follow-up in 3 to 6 months.

## 2021-01-18 NOTE — Progress Notes (Signed)
Please inform patient of the following:  His A1c is up since last time. Everything else is stable. Recommend he increase ozempic to 1mg  weekly. I would like to see him back in 3 months to recheck his A1c. He should continue working on diet and exercise and we can recheck everything else in a year.  Algis Greenhouse. Jerline Pain, MD 01/18/2021 12:37 PM

## 2021-01-19 ENCOUNTER — Other Ambulatory Visit: Payer: Self-pay | Admitting: *Deleted

## 2021-01-19 MED ORDER — SEMAGLUTIDE (1 MG/DOSE) 4 MG/3ML ~~LOC~~ SOPN: 1.0000 mg | PEN_INJECTOR | SUBCUTANEOUS | 3 refills | Status: AC

## 2021-01-20 ENCOUNTER — Telehealth: Payer: Self-pay | Admitting: *Deleted

## 2021-01-20 ENCOUNTER — Other Ambulatory Visit: Payer: Self-pay | Admitting: *Deleted

## 2021-01-20 NOTE — Telephone Encounter (Signed)
PA (Key: [id number]) Rx #: [id number] Ozempic (1 MG/DOSE) 4MG [place] Mediate pen-injectors Send today  Waiting for determination

## 2021-01-20 NOTE — Telephone Encounter (Signed)
Status:Approved;Review Type:Prior Auth;Coverage Start Date:01/20/2021;Coverage End Date:01/20/2022 Pharmacy notified

## 2021-02-12 ENCOUNTER — Telehealth: Payer: Self-pay | Admitting: *Deleted

## 2021-02-12 NOTE — Telephone Encounter (Signed)
PA (Key: [id number]) Ozempic (0.25 or 0.5 MG/DOSE) 2MG /1.5ML pen-injectors Send Today   Drug is covered by current benefit plan. No further PA activity needed

## 2021-02-12 NOTE — Telephone Encounter (Signed)
error 

## 2021-03-03 ENCOUNTER — Other Ambulatory Visit: Payer: Self-pay | Admitting: Internal Medicine

## 2021-03-03 DIAGNOSIS — N5312 Painful ejaculation: Secondary | ICD-10-CM

## 2021-03-04 ENCOUNTER — Ambulatory Visit
Admission: RE | Admit: 2021-03-04 | Discharge: 2021-03-04 | Disposition: A | Discharge status: Home / Self Care | Payer: Managed Care, Other (non HMO) | Source: Ambulatory Visit | Attending: Internal Medicine | Admitting: Internal Medicine

## 2021-03-04 DIAGNOSIS — N5312 Painful ejaculation: Secondary | ICD-10-CM

## 2021-03-20 ENCOUNTER — Other Ambulatory Visit: Payer: Self-pay | Admitting: Family Medicine

## 2021-03-29 ENCOUNTER — Other Ambulatory Visit: Payer: Self-pay

## 2021-03-29 ENCOUNTER — Encounter (HOSPITAL_COMMUNITY): Payer: Self-pay

## 2021-03-29 ENCOUNTER — Emergency Department (HOSPITAL_COMMUNITY)
Admission: EM | Admit: 2021-03-29 | Discharge: 2021-03-29 | Disposition: A | Discharge status: Home / Self Care | Payer: Managed Care, Other (non HMO) | Attending: Emergency Medicine | Admitting: Emergency Medicine

## 2021-03-29 ENCOUNTER — Emergency Department (HOSPITAL_COMMUNITY): Payer: Managed Care, Other (non HMO)

## 2021-03-29 DIAGNOSIS — E119 Type 2 diabetes mellitus without complications: Secondary | ICD-10-CM | POA: Insufficient documentation

## 2021-03-29 DIAGNOSIS — Z794 Long term (current) use of insulin: Secondary | ICD-10-CM | POA: Diagnosis not present

## 2021-03-29 DIAGNOSIS — I10 Essential (primary) hypertension: Secondary | ICD-10-CM | POA: Insufficient documentation

## 2021-03-29 DIAGNOSIS — R0789 Other chest pain: Secondary | ICD-10-CM | POA: Insufficient documentation

## 2021-03-29 DIAGNOSIS — Z7984 Long term (current) use of oral hypoglycemic drugs: Secondary | ICD-10-CM | POA: Insufficient documentation

## 2021-03-29 DIAGNOSIS — R079 Chest pain, unspecified: Secondary | ICD-10-CM

## 2021-03-29 LAB — BASIC METABOLIC PANEL
Anion gap: 6 (ref 5–15)
BUN: 14 mg/dL (ref 6–20)
CO2: 21 mmol/L — ABNORMAL LOW (ref 22–32)
Calcium: 9.3 mg/dL (ref 8.9–10.3)
Chloride: 110 mmol/L (ref 98–111)
Creatinine, Ser: 1.29 mg/dL — ABNORMAL HIGH (ref 0.61–1.24)
GFR, Estimated: >60 mL/min (ref >60)
Glucose, Bld: 97 mg/dL (ref 70–99)
Potassium: 3.8 mmol/L (ref 3.5–5.1)
Sodium: 137 mmol/L (ref 135–145)

## 2021-03-29 LAB — CBC
HCT: 43.9 % (ref 39.0–52.0)
Hemoglobin: 15 g/dL (ref 13.0–17.0)
MCH: 30.1 pg (ref 26.0–34.0)
MCHC: 34.2 g/dL (ref 30.0–36.0)
MCV: 88 fL (ref 80.0–100.0)
Platelets: 230 10*3/uL (ref 150–400)
RBC: 4.99 MIL/uL (ref 4.22–5.81)
RDW: 14.3 % (ref 11.5–15.5)
WBC: 9.2 10*3/uL (ref 4.0–10.5)
nRBC: 0 % (ref 0.0–0.2)

## 2021-03-29 LAB — TROPONIN I (HIGH SENSITIVITY)
Troponin I (High Sensitivity): 20 ng/L — ABNORMAL HIGH (ref <18)
Troponin I (High Sensitivity): 21 ng/L — ABNORMAL HIGH (ref <18)

## 2021-03-29 LAB — D-DIMER, QUANTITATIVE: D-Dimer, Quant: <0.27 ug/mL-FEU (ref 0.00–0.50)

## 2021-03-29 NOTE — ED Provider Triage Note (Signed)
Emergency Medicine Provider Triage Evaluation Note  Alan Payne , a 51 y.o. male  was evaluated in triage.  Pt complains of intermittent sternal chest pressure x1 week. Has associated shortness of breath.  Has not tried medication for symptoms.  Denies abdominal pain, nausea, vomiting, fever, chills, cough. Recent 10-hour to [place] within a 10-day span.  He notes that he was mobile during the flight. Denies anticoagulant use denies past medical history of DVT/PE, MI, cardiac catheterization, stents, GERD.  Has a history of hypertension and diabetes and is compliant with his medication.  Per patient his CBG was 107 this morning.   Review of Systems  Positive: As per HPI above Negative:  Physical Exam  BP (!) 191/104 (BP Location: Left Arm)    Pulse 89    Temp 98.7 F (37.1 C) (Oral)    Resp 20    Ht 6' (1.829 m)    Wt 129.7 kg    SpO2 98%    BMI 38.79 kg/m  Gen:   Awake, no distress   Resp:  Normal effort  MSK:   Moves extremities without difficulty  Other:  No chest wall tenderness to palpation.  No abdominal tenderness to palpation.  Medical Decision Making  Medically screening exam initiated at 5:39 PM.  Appropriate orders placed.  Alan Payne. was informed that the remainder of the evaluation will be completed by another provider, this initial triage assessment does not replace that evaluation, and the importance of remaining in the ED until their evaluation is complete.    Sayaka Hoeppner A, PA-C 03/29/21 1741

## 2021-03-29 NOTE — ED Triage Notes (Signed)
Patient reports that he has been having intermittent chest pressure x 1 week and today was more frequent episodes. Patient states that when he exhales he pressure increases and after eating.   Patient also reports he flew to [place] with in 10 day span and back.

## 2021-03-29 NOTE — ED Provider Notes (Signed)
[facility] DEPT Provider Note   CSN: [id number] Arrival date & time: 03/29/21  1647     History  Chief Complaint  Patient presents with   Chest Pain    Douglass Dunshee. is a 51 y.o. male history of diabetes, hypertension here presenting with chest pain.  Patient went to [place] and was there for about 10 days.  Patient came back about a week ago.  Patient states that he has been working constantly since then.  He noticed that he has intermittent chest pressure.  He states that it happened every other day and is associated with some shortness of breath.  Patient states that he had another episode today around noon time.  He states that it lasted about 15 minutes.  He states that it is a sense of pressure.  Denies any abdominal pain.   The history is provided by the patient.      Home Medications Prior to Admission medications   Medication Sig Start Date End Date Taking? Authorizing Provider  atorvastatin (LIPITOR) 40 MG tablet TAKE 1 TABLET BY MOUTH EVERY DAY 01/07/21   Vivi Barrack, MD  Ginkgo Biloba 40 MG TABS Take 1 tablet by mouth every evening.     [provider]  insulin lispro (HUMALOG) 100 UNIT/ML injection Inject 0.05-0.1 mLs (5-10 Units total) into the skin daily. Use as needed for blood sugar >300 01/10/20   Vivi Barrack, MD  losartan (COZAAR) 25 MG tablet TAKE 1 TABLET BY MOUTH EVERY DAY 03/20/21   Marin Olp, MD  metFORMIN (GLUCOPHAGE) 1000 MG tablet TAKE 1 TABLET (1,000 MG TOTAL) BY MOUTH 2 (TWO) TIMES DAILY WITH A MEAL. 04/26/20   Vivi Barrack, MD  Multiple Vitamin (MULTIVITAMIN) tablet Take 1 tablet by mouth daily.    [provider]  ondansetron (ZOFRAN-ODT) 8 MG disintegrating tablet Take 1 tablet (8 mg total) by mouth every 8 (eight) hours as needed for nausea or vomiting. 12/10/20   Allwardt, Randa Evens, PA-C  Semaglutide, 1 MG/DOSE, 4 MG/3ML SOPN Inject 1 mg as directed once a week. 01/19/21   Vivi Barrack, MD  tamsulosin (FLOMAX) 0.4 MG CAPS capsule TAKE 1 CAPSULE BY MOUTH EVERY DAY Patient taking differently: Take by mouth as needed. 01/29/18   Vivi Barrack, MD  traMADol Veatrice Bourbon) 50 MG tablet Take 1-2 tablets every 8 hours as needed for moderate pain 12/10/20   Allwardt, Randa Evens, PA-C      Allergies    Nsaids and Sitagliptin-metformin hcl    Review of Systems   Review of Systems  Cardiovascular:  Positive for chest pain.  All other systems reviewed and are negative.  Physical Exam Updated Vital Signs BP (!) 177/103 (BP Location: Left Arm)    Pulse 86    Temp 98.7 F (37.1 C) (Oral)    Resp 18    Ht 6' (1.829 m)    Wt 129.7 kg    SpO2 99%    BMI 38.79 kg/m  Physical Exam Vitals and nursing note reviewed.  Constitutional:      Appearance: He is well-developed.  HENT:     Head: Normocephalic.  Eyes:     Extraocular Movements: Extraocular movements intact.     Pupils: Pupils are equal, round, and reactive to light.  Cardiovascular:     Rate and Rhythm: Normal rate and regular rhythm.     Heart sounds: Normal heart sounds.  Pulmonary:     Effort: Pulmonary  effort is normal.     Breath sounds: Normal breath sounds.  Abdominal:     General: Bowel sounds are normal.     Palpations: Abdomen is soft.  Musculoskeletal:        General: Normal range of motion.     Cervical back: Normal range of motion and neck supple.  Skin:    General: Skin is warm.     Capillary Refill: Capillary refill takes less than 2 seconds.  Neurological:     General: No focal deficit present.     Mental Status: He is alert and oriented to person, place, and time.  Psychiatric:        Mood and Affect: Mood normal.        Behavior: Behavior normal.    ED Results / Procedures / Treatments   Labs (all labs ordered are listed, but only abnormal results are displayed) Labs Reviewed  BASIC METABOLIC PANEL - Abnormal; Notable for the following components:      Result Value   CO2 21 (*)     Creatinine, Ser 1.29 (*)    All other components within normal limits  TROPONIN I (HIGH SENSITIVITY) - Abnormal; Notable for the following components:   Troponin I (High Sensitivity) 20 (*)    All other components within normal limits  TROPONIN I (HIGH SENSITIVITY) - Abnormal; Notable for the following components:   Troponin I (High Sensitivity) 21 (*)    All other components within normal limits  CBC  D-DIMER, QUANTITATIVE    EKG None  Radiology DG Chest 2 View  Result Date: 03/29/2021 CLINICAL DATA:  Chest pain for 1 week. EXAM: CHEST - 2 VIEW COMPARISON:  None. FINDINGS: The heart size and mediastinal contours are within normal limits. Both lungs are clear. Small nodularity projected over the right lung base, likely nipple, repeat frontal view with nipple marker is recommended. The visualized skeletal structures are unremarkable. IMPRESSION: No active cardiopulmonary disease. Electronically Signed   By: Abelardo Diesel M.D.   On: 03/29/2021 17:56    Procedures Procedures    Medications Ordered in ED Medications - No data to display  ED Course/ Medical Decision Making/ A&P                           Medical Decision Making Copelan Maultsby. is a 51 y.o. male here presenting with chest pain.  Patient has intermittent chest pain for the last week or so.  It is a sense of pressure. Patient recently traveled from [place].  Consider ACS versus PE.  Plan to get CBC and BMP and troponin x2 and D-dimer and chest x-ray  10:58 PM Labs reviewed.  Initial troponin was 20.  Repeat troponin is 21.  D-dimer is negative.  Chest x-ray is clear.  At this point, I recommend that he takes aspirin 81 mg daily.  Also recommend that he gets outpatient stress test with cardiology   Problems Addressed: Chest pain, unspecified type: acute illness or injury  Amount and/or Complexity of Data Reviewed Labs: ordered. Decision-making details documented in ED Course. Radiology: ordered and independent  interpretation performed. Decision-making details documented in ED Course. ECG/medicine tests: ordered and independent interpretation performed. Decision-making details documented in ED Course.   ED Diagnoses Final diagnoses:  None    Rx / DC Orders ED Discharge Orders     None         Drenda Freeze, MD 03/29/21 2259

## 2021-03-29 NOTE — Discharge Instructions (Signed)
Take aspirin 81 mg daily.  You should get a stress test with a cardiologist.  Return to ER if you have worse chest pain, shortness of breath.

## 2021-04-20 ENCOUNTER — Encounter: Payer: Managed Care, Other (non HMO) | Attending: Internal Medicine | Admitting: Dietician

## 2021-04-20 ENCOUNTER — Encounter: Payer: Self-pay | Admitting: Dietician

## 2021-04-20 ENCOUNTER — Other Ambulatory Visit: Payer: Self-pay

## 2021-04-20 DIAGNOSIS — E119 Type 2 diabetes mellitus without complications: Secondary | ICD-10-CM | POA: Diagnosis present

## 2021-04-20 NOTE — Patient Instructions (Addendum)
Work on moderating your consumption of fruit juice. Choose a Zero sugar soda or juice to switch it up! ? ?Try to start your day with a balanced breakfast within an hour of waking up. ? ?Work towards eating three meals a day, about 5-6 hours apart! ? ?Begin to recognize carbohydrates, proteins, and non-starchy vegetables in your food choices! ? ?Begin to build your meals using the proportions of the Balanced Plate. ?First, select your carb choice(s) for the meal. Make this 25% of your meal. ?Next, select your source of protein to pair with your carb choice(s). Make this another 25% of your meal. ?Finally, complete your meal with a variety of non-starchy vegetables. Make this the remaining 50% of your meal. ? ?Check your blood sugar each morning before eating or drinking (fasting). ?Look for numbers under 125 mg/dL ?Check your blood sugar 2 hours after you begin eating a meal. ?Look for numbers under 180 mg/dL at all times. ?Your goal A1c is below 7.0% ? ? ?

## 2021-04-20 NOTE — Progress Notes (Signed)
Diabetes Self-Management Education ? ?Visit Type: First/Initial ? ?Appt. Start Time: 1115 Appt. End Time: 1791 ? ?04/20/2021 ? ?Alan Payne, identified by name and date of birth, is a 51 y.o. male with a diagnosis of Diabetes: Type 2.  ? ?ASSESSMENT ?Pt reports an allergy to Janumet that caused itching, switched to metformin and Ozempic. Pt reports they get occasional GI upset, takes Metformin before their breakfast. Pt has noticed a decreased appetite with Ozempic. ?Pt reports history of inadequate sleep (4-5 hours) and is trying to get about 7 hours a night now. ?Pt is taking Losartan and Lipitor as preventative cardiovascular medications. Pt states that their cholesterol and blood pressure have crept up over the past couple of years and they want to keep them controlled. Pt reports getting occasional tingling/numbness in their foot and thigh area. Pt had this checked and found no nerve damage or clotting. ?Pt reports history of uric acid kidney stones in the past 4 years (~40 stones), pt is taking Allopurinal to minimize stone formation. ?Pt reports checking FBG daily, usual range is 108-120 mg/dL. ?Pt states they have been sedentary recently, but has started walking about a half an hour every other day with their wife. ?Pt reports not eating before 10:00 am, usually only has two meals a day, doesn't snack throughout the day. Pt reports having a big sweet tooth, will over-consume desserts and SSBs. ? ?Height 6' (1.829 m), weight 296 lb (134.3 kg). ?Body mass index is 40.14 kg/m?. ? ? Diabetes Self-Management Education - 04/20/21 1123   ? ?  ? Visit Information  ? Visit Type First/Initial   ?  ? Initial Visit  ? Diabetes Type Type 2   ? Are you currently following a meal plan? No   ? Are you taking your medications as prescribed? Yes   ? Date Diagnosed Early 2000's   ?  ? Health Coping  ? How would you rate your overall health? Fair   Stopped exercising a couple of years ago  ?  ? Psychosocial Assessment  ?  Patient Belief/Attitude about Diabetes Defeat/Burnout   Inconsistent with managing diabetes over the years, "yo-yo A1c"  ? Self-care barriers None   ? Self-management support Family   ? Other persons present Patient   ? Patient Concerns Glycemic Control;Healthy Lifestyle   ? Special Needs None   ? Preferred Learning Style No preference indicated   ? Learning Readiness Ready   ? How often do you need to have someone help you when you read instructions, pamphlets, or other written materials from your doctor or pharmacy? 1 - Never   ? What is the last grade level you completed in school? College   ?  ? Pre-Education Assessment  ? Patient understands the diabetes disease and treatment process. Needs Instruction   ? Patient understands incorporating nutritional management into lifestyle. Needs Instruction   ? Patient undertands incorporating physical activity into lifestyle. Needs Instruction   ? Patient understands using medications safely. Needs Instruction   ? Patient understands monitoring blood glucose, interpreting and using results Needs Instruction   ? Patient understands prevention, detection, and treatment of acute complications. Needs Instruction   ? Patient understands prevention, detection, and treatment of chronic complications. Needs Instruction   ? Patient understands how to develop strategies to address psychosocial issues. Needs Instruction   ? Patient understands how to develop strategies to promote health/change behavior. Needs Instruction   ?  ? Complications  ? Last HgB A1C per patient/outside source 8.7 %  01/15/21  ? How often do you check your blood sugar? 1-2 times/day   ? Fasting Blood glucose range (mg/dL) 70-129   ? Have you had a dilated eye exam in the past 12 months? Yes   ? Have you had a dental exam in the past 12 months? Yes   ? Are you checking your feet? No   PCP is checking pt feet.  ?  ? Dietary Intake  ? Breakfast None   ? Lunch [place] sandwich, 8 oz. cranberry juice   ? Dinner  Chicken alfredo, garden salad, water, sweet tea   ? Beverage(s) water, cranberry juice,   ?  ? Exercise  ? Exercise Type ADL's;Light (walking / raking leaves)   ? How many days per week to you exercise? 3   ? How many minutes per day do you exercise? 30   ? Total minutes per week of exercise 90   ?  ? Patient Education  ? Previous Diabetes Education Yes (please comment)   First diagnosis in early 2000's  ? Disease state  Factors that contribute to the development of diabetes;Explored patient's options for treatment of their diabetes   ? Nutrition management  Food label reading, portion sizes and measuring food.;Role of diet in the treatment of diabetes and the relationship between the three main macronutrients and blood glucose level;Reviewed blood glucose goals for pre and post meals and how to evaluate the patients' food intake on their blood glucose level.;Meal options for control of blood glucose level and chronic complications.   ? Physical activity and exercise  Role of exercise on diabetes management, blood pressure control and cardiac health.   ? Medications Reviewed patients medication for diabetes, action, purpose, timing of dose and side effects.   ? Monitoring Purpose and frequency of SMBG.;Identified appropriate SMBG and/or A1C goals.   ? Acute complications Discussed and identified patients' treatment of hyperglycemia.   ? Chronic complications Relationship between chronic complications and blood glucose control;Assessed and discussed foot care and prevention of foot problems;Lipid levels, blood glucose control and heart disease   ? Psychosocial adjustment Role of stress on diabetes   ?  ? Individualized Goals (developed by patient)  ? Nutrition Follow meal plan discussed   ? Physical Activity Exercise 3-5 times per week   ? Medications take my medication as prescribed   ? Monitoring  test my blood glucose as discussed   ?  ? Post-Education Assessment  ? Patient understands the diabetes disease and  treatment process. Needs Review   ? Patient understands incorporating nutritional management into lifestyle. Needs Review   ? Patient undertands incorporating physical activity into lifestyle. Needs Review   ? Patient understands using medications safely. Demonstrates understanding / competency   ? Patient understands monitoring blood glucose, interpreting and using results Needs Review   ? Patient understands prevention, detection, and treatment of acute complications. Needs Review   ? Patient understands prevention, detection, and treatment of chronic complications. Needs Review   ? Patient understands how to develop strategies to address psychosocial issues. Needs Review   ? Patient understands how to develop strategies to promote health/change behavior. Needs Review   ?  ? Outcomes  ? Expected Outcomes Demonstrated interest in learning. Expect positive outcomes   ? Future DMSE 2 months   ? Program Status Not Completed   ? ?  ?  ? ?  ? ? ?Individualized Plan for Diabetes Self-Management Training:  ? ?Learning Objective:  Patient will have a greater understanding  of diabetes self-management. ?Patient education plan is to attend individual and/or group sessions per assessed needs and concerns. ?  ?Plan:  ? ?Patient Instructions  ?Work on moderating your consumption of fruit juice. Choose a Zero sugar soda or juice to switch it up! ? ?Try to start your day with a balanced breakfast within an hour of waking up. ? ?Work towards eating three meals a day, about 5-6 hours apart! ? ?Begin to recognize carbohydrates, proteins, and non-starchy vegetables in your food choices! ? ?Begin to build your meals using the proportions of the Balanced Plate. ?First, select your carb choice(s) for the meal. Make this 25% of your meal. ?Next, select your source of protein to pair with your carb choice(s). Make this another 25% of your meal. ?Finally, complete your meal with a variety of non-starchy vegetables. Make this the remaining  50% of your meal. ? ?Check your blood sugar each morning before eating or drinking (fasting). ?Look for numbers under 125 mg/dL ?Check your blood sugar 2 hours after you begin eating a meal. ?Look for numbers

## 2021-04-23 ENCOUNTER — Ambulatory Visit: Payer: Managed Care, Other (non HMO) | Admitting: Family Medicine

## 2021-04-29 ENCOUNTER — Other Ambulatory Visit: Payer: Self-pay | Admitting: Internal Medicine

## 2021-04-29 ENCOUNTER — Other Ambulatory Visit (HOSPITAL_COMMUNITY): Payer: Self-pay | Admitting: Emergency Medicine

## 2021-04-29 DIAGNOSIS — I429 Cardiomyopathy, unspecified: Secondary | ICD-10-CM

## 2021-04-29 DIAGNOSIS — R079 Chest pain, unspecified: Secondary | ICD-10-CM

## 2021-04-29 MED ORDER — METOPROLOL TARTRATE 100 MG PO TABS: 100.0000 mg | ORAL_TABLET | Freq: Once | ORAL | 0 refills | Status: DC

## 2021-04-29 MED ORDER — IVABRADINE HCL 5 MG PO TABS: 15.0000 mg | ORAL_TABLET | Freq: Once | ORAL | 0 refills | Status: AC

## 2021-04-29 NOTE — Progress Notes (Signed)
$'100mg'D$  metoprolol tartrate + '15mg'$  ivabradine one time dose ordered for CCTA HR control. ?Marchia Bond RN Navigator Cardiac Imaging ?[facility] ?[phone] Office  ?[phone] Cell ? ?

## 2021-05-03 ENCOUNTER — Other Ambulatory Visit (HOSPITAL_COMMUNITY): Payer: Self-pay | Admitting: Emergency Medicine

## 2021-05-04 ENCOUNTER — Telehealth (HOSPITAL_COMMUNITY): Payer: Self-pay | Admitting: Emergency Medicine

## 2021-05-04 ENCOUNTER — Encounter (HOSPITAL_COMMUNITY): Payer: Self-pay

## 2021-05-04 NOTE — Telephone Encounter (Signed)
Reaching out to patient to offer assistance regarding upcoming cardiac imaging study; pt verbalizes understanding of appt date/time, parking situation and where to check in, pre-test NPO status and medications ordered, and verified current allergies; name and call back number provided for further questions should they arise ?Marchia Bond RN Navigator Cardiac Imaging ?[facility] ?[phone] office ?[phone] cell ? ?'100mg'$  metoprolol + '15mg'$  ivabradine  ?R arm preferred for iv  ?arrival 830 ? ?

## 2021-05-04 NOTE — Telephone Encounter (Signed)
Attempted to call patient regarding upcoming cardiac CT appointment. °Left message on voicemail with name and callback number °Stephane Niemann RN Navigator Cardiac Imaging °[facility] °[phone] Office °[phone] Cell ° °

## 2021-05-05 ENCOUNTER — Other Ambulatory Visit (HOSPITAL_COMMUNITY): Payer: Self-pay

## 2021-05-06 ENCOUNTER — Ambulatory Visit
Admission: RE | Admit: 2021-05-06 | Discharge: 2021-05-06 | Disposition: A | Discharge status: Home / Self Care | Payer: Managed Care, Other (non HMO) | Source: Ambulatory Visit | Attending: Internal Medicine | Admitting: Internal Medicine

## 2021-05-06 ENCOUNTER — Other Ambulatory Visit (HOSPITAL_COMMUNITY): Payer: Self-pay | Admitting: Emergency Medicine

## 2021-05-06 ENCOUNTER — Other Ambulatory Visit: Payer: Self-pay

## 2021-05-06 DIAGNOSIS — R079 Chest pain, unspecified: Secondary | ICD-10-CM

## 2021-05-06 DIAGNOSIS — I429 Cardiomyopathy, unspecified: Secondary | ICD-10-CM | POA: Diagnosis present

## 2021-05-06 MED ORDER — IOHEXOL 350 MG/ML SOLN
100.0000 mL | Freq: Once | INTRAVENOUS | Status: AC | PRN
Start: 2021-05-06 — End: 2021-05-06
  Administered 2021-05-06: 100 mL via INTRAVENOUS

## 2021-05-06 MED ORDER — METOPROLOL TARTRATE 5 MG/5ML IV SOLN
10.0000 mg | Freq: Once | INTRAVENOUS | Status: AC
  Administered 2021-05-06: 10 mg via INTRAVENOUS

## 2021-05-06 MED ORDER — NITROGLYCERIN 0.4 MG SL SUBL: 0.8000 mg | SUBLINGUAL_TABLET | Freq: Once | SUBLINGUAL | Status: DC

## 2021-05-06 MED ORDER — DILTIAZEM HCL 25 MG/5ML IV SOLN
10.0000 mg | Freq: Once | INTRAVENOUS | Status: AC
Start: 2021-05-06 — End: 2021-05-06
  Administered 2021-05-06: 10 mg via INTRAVENOUS

## 2021-05-06 MED ORDER — METOPROLOL TARTRATE 100 MG PO TABS: 100.0000 mg | ORAL_TABLET | Freq: Once | ORAL | 0 refills | Status: AC

## 2021-05-06 NOTE — Progress Notes (Signed)
Patient arrived for Cardiac CT. Patient took Metoprolol Tartrate 100 mg however did not take Ivabradine 15 mg. Thought when he picked up both medications at [facility] both however the second medication was one of his regular medications. Unable to perform Cardiac CT due to elevated heart rate. Spoke with Dr Garen Lah and patient rescheduled for Monday at 0800 with both pre medications to be taken 2 hours before cardiac CT. Patient verbalized understanding of plan of care.  ?

## 2021-05-06 NOTE — Progress Notes (Addendum)
Pt was prescribed to take '100mg'$  metoprolol + '15mg'$  ivabradine for todays CCTA. Unfortunately only took '100mg'$  metoprolol and was unable to control HR. ? ?Re prescribing '100mg'$  metop and will call pharm to ensure the ivab is filled for his next attempt for CCTA. ? ?Marchia Bond RN Navigator Cardiac Imaging ?[facility] ?[phone] Office  ?[phone] Cell ? ?

## 2021-05-10 ENCOUNTER — Ambulatory Visit
Admission: RE | Admit: 2021-05-10 | Discharge: 2021-05-10 | Disposition: A | Discharge status: Home / Self Care | Payer: Managed Care, Other (non HMO) | Source: Ambulatory Visit | Attending: Internal Medicine | Admitting: Internal Medicine

## 2021-05-10 DIAGNOSIS — I429 Cardiomyopathy, unspecified: Secondary | ICD-10-CM

## 2021-05-10 MED ORDER — METOPROLOL TARTRATE 5 MG/5ML IV SOLN
10.0000 mg | Freq: Once | INTRAVENOUS | Status: AC
  Administered 2021-05-10: 10 mg via INTRAVENOUS

## 2021-05-10 MED ORDER — NITROGLYCERIN 0.4 MG SL SUBL
0.8000 mg | SUBLINGUAL_TABLET | Freq: Once | SUBLINGUAL | Status: AC
  Administered 2021-05-10: 0.8 mg via SUBLINGUAL

## 2021-05-10 MED ORDER — DILTIAZEM HCL 25 MG/5ML IV SOLN
10.0000 mg | Freq: Once | INTRAVENOUS | Status: AC
  Administered 2021-05-10: 10 mg via INTRAVENOUS

## 2021-05-10 MED ORDER — METOPROLOL TARTRATE 5 MG/5ML IV SOLN
10.0000 mg | Freq: Once | INTRAVENOUS | Status: DC
Start: 2021-05-10 — End: 2021-05-11

## 2021-05-10 MED ORDER — IOHEXOL 350 MG/ML SOLN
100.0000 mL | Freq: Once | INTRAVENOUS | Status: AC | PRN
Start: 2021-05-10 — End: 2021-05-10
  Administered 2021-05-10: 100 mL via INTRAVENOUS

## 2021-05-10 NOTE — Progress Notes (Signed)
Patient tolerated procedure well. Ambulate w/o difficulty. Denies light headedness or being dizzy. Sitting in chair drinking water provided. Encouraged to drink extra water today and reasoning explained. Verbalized understanding. All questions answered. ABC intact. No further needs. Discharge from procedure area w/o issues.   °

## 2021-06-19 ENCOUNTER — Other Ambulatory Visit: Payer: Self-pay | Admitting: Family Medicine

## 2021-06-29 ENCOUNTER — Ambulatory Visit: Payer: Managed Care, Other (non HMO) | Admitting: Dietician

## 2021-07-15 ENCOUNTER — Other Ambulatory Visit: Payer: Self-pay | Admitting: Family Medicine

## 2021-07-16 ENCOUNTER — Ambulatory Visit: Payer: Managed Care, Other (non HMO) | Admitting: Family Medicine

## 2021-10-20 ENCOUNTER — Other Ambulatory Visit: Payer: Self-pay | Admitting: Family Medicine

## 2022-05-21 IMAGING — CT CT HEART MORP W/ CTA COR W/ SCORE W/ CA W/CM &/OR W/O CM
2 of 13 series · 5 of 20 positions shown, 6 images · non-contrast
Comparison: 03/29/2021 chest radiograph

Addendum:
CLINICAL DATA: Chest pain

EXAM:
Cardiac/Coronary  CTA
TECHNIQUE: The patient was scanned on a Siemens Somatom go.Top scanner.

[Series 27: multiphase % cta coronary 0.80 · axial · 0.40mm/px · z∈[-1126,-1056]mm · 3 of 2808 slices shown]
[im 702/2808  vessel]
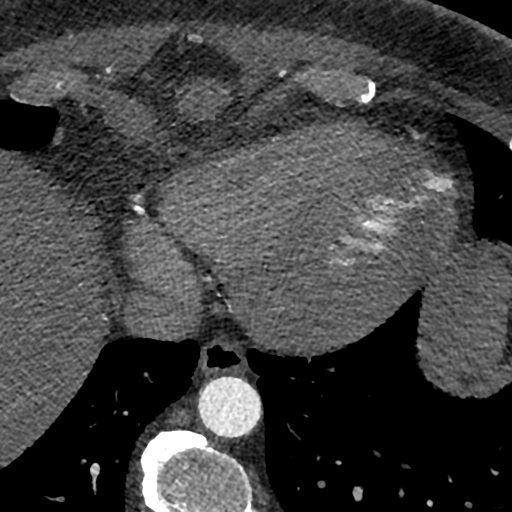
[im 1404/2808  vessel]
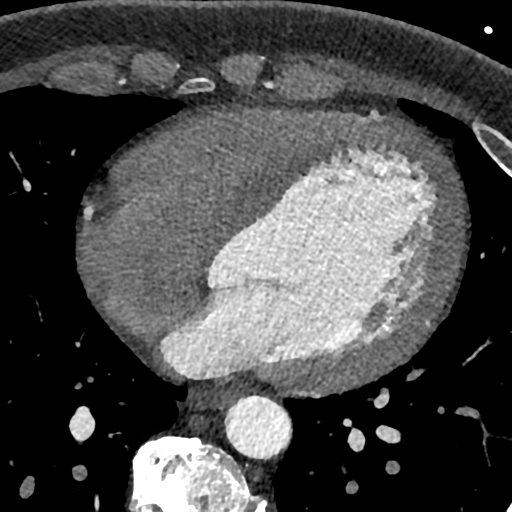
[im 2106/2808  vessel]
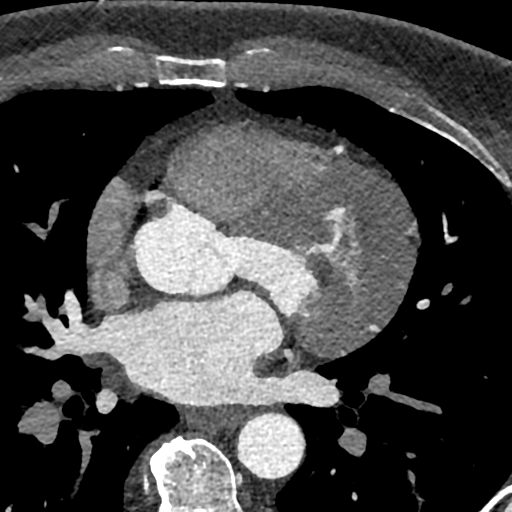

[Series 35: ms multiphase cta coronary 0.60 · axial · 0.40mm/px · z∈[-1114,-1068]mm · 2 of 2464 slices shown, 3 images]
[im 822/2464  vessel]
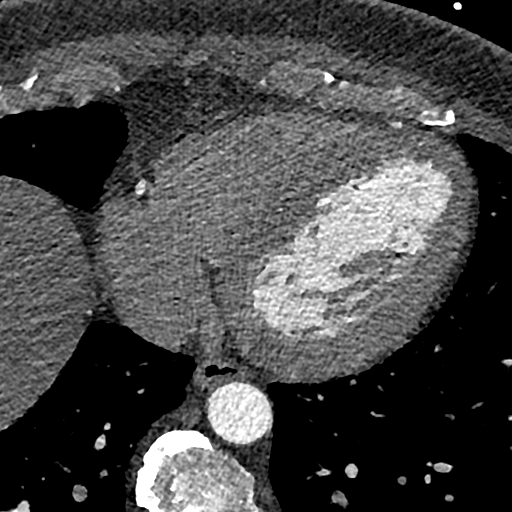
[im 822/2464  lung]
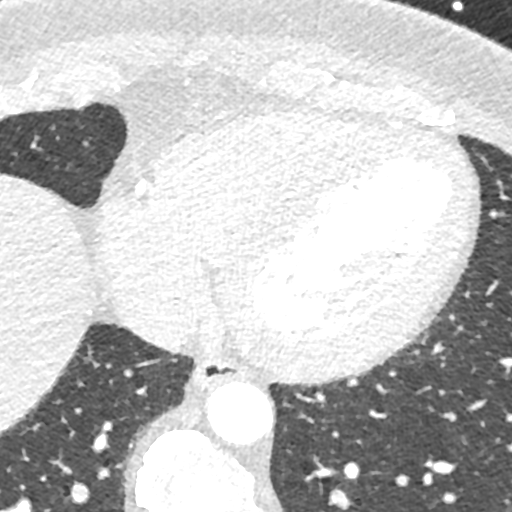
[im 1643/2464  vessel]
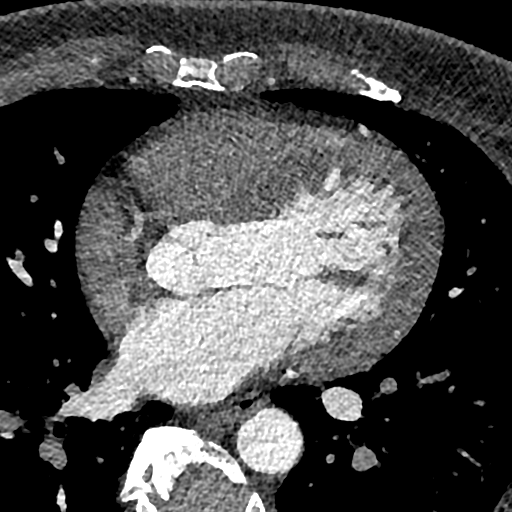

[5 of 20 positions shown; findings below may reference images not displayed]



Aortic Valve:  Trileaflet.  No calcifications.

Coronary Arteries:  Normal coronary origin.  Right dominance.

RCA is a dominant artery that gives rise to PDA and PLA. There is no
plaque.

Left main gives rise to LAD and LCX arteries.  LM has no disease.

LAD has no plaque.

LCX is a non-dominant artery that gives rise to three obtuse
marginal branches. There is no plaque.

Other findings:

Normal pulmonary vein drainage into the left atrium.

Normal left atrial appendage without a thrombus.

Normal size of the pulmonary artery.
IMPRESSION: 1. Normal coronary calcium score of 0. Patient is low risk for
coronary events.

2. Normal coronary origin with right dominance.

3. No evidence of CAD.

4. CAD-RADS 0. Consider non-atherosclerotic causes of chest pain.

EXAM:
OVER-READ INTERPRETATION  CT CHEST

The following report is an over-read performed by radiologist Dr.
Tae Ju Kho [REDACTED] on 05/10/2021. This over-read
does not include interpretation of cardiac or coronary anatomy or
pathology. The coronary CTA interpretation by the cardiologist is
attached.
FINDINGS: Vascular: Normal aortic caliber. No central pulmonary embolism, on
this non-dedicated study.

Mediastinum/Nodes: No imaged thoracic adenopathy.

Lungs/Pleura: No pleural fluid.  Clear imaged lungs.

Upper Abdomen: Normal imaged portions of the liver, spleen, stomach.

Musculoskeletal: Right greater than left gynecomastia, moderate on
the right. No acute osseous abnormality. Lower thoracic spondylosis.
IMPRESSION: No acute findings in the imaged extracardiac chest.

Right greater than left gynecomastia.



Aortic Valve:  Trileaflet.  No calcifications.

Coronary Arteries:  Normal coronary origin.  Right dominance.

RCA is a dominant artery that gives rise to PDA and PLA. There is no
plaque.

Left main gives rise to LAD and LCX arteries.  LM has no disease.

LAD has no plaque.

LCX is a non-dominant artery that gives rise to three obtuse
marginal branches. There is no plaque.

Other findings:

Normal pulmonary vein drainage into the left atrium.

Normal left atrial appendage without a thrombus.

Normal size of the pulmonary artery.
IMPRESSION: 1. Normal coronary calcium score of 0. Patient is low risk for
coronary events.

2. Normal coronary origin with right dominance.

3. No evidence of CAD.

4. CAD-RADS 0. Consider non-atherosclerotic causes of chest pain.

## 2022-05-26 ENCOUNTER — Other Ambulatory Visit: Payer: Self-pay | Admitting: Family Medicine

## 2023-05-11 LAB — OPHTHALMOLOGY REPORT-SCANNED

## 2023-05-19 ENCOUNTER — Telehealth: Payer: Self-pay | Admitting: Internal Medicine

## 2023-05-19 NOTE — Telephone Encounter (Signed)
 LVM asking pt to clarify if he has switched PCPs or not. Also informed him that an appointment will need to be made prior to 01/2024 or else he will age out as a patient and need to re-establish care if desired.

## 2023-05-22 ENCOUNTER — Other Ambulatory Visit: Payer: Self-pay | Admitting: Urology

## 2023-05-22 DIAGNOSIS — N503 Cyst of epididymis: Secondary | ICD-10-CM

## 2023-06-01 ENCOUNTER — Ambulatory Visit
Admission: RE | Admit: 2023-06-01 | Discharge: 2023-06-01 | Discharge status: Home / Self Care | Source: Ambulatory Visit | Attending: Urology

## 2023-06-01 DIAGNOSIS — N503 Cyst of epididymis: Secondary | ICD-10-CM

## 2024-03-13 ENCOUNTER — Ambulatory Visit: Payer: Self-pay | Admitting: Internal Medicine

## 2024-03-13 ENCOUNTER — Encounter: Payer: Self-pay | Admitting: Internal Medicine

## 2024-03-13 VITALS — BP 128/78 | HR 73 | Temp 98.6°F | Ht 72.0 in | Wt 275.3 lb

## 2024-03-13 DIAGNOSIS — E1169 Type 2 diabetes mellitus with other specified complication: Secondary | ICD-10-CM | POA: Insufficient documentation

## 2024-03-13 DIAGNOSIS — I10 Essential (primary) hypertension: Secondary | ICD-10-CM

## 2024-03-13 DIAGNOSIS — E538 Deficiency of other specified B group vitamins: Secondary | ICD-10-CM

## 2024-03-13 DIAGNOSIS — E119 Type 2 diabetes mellitus without complications: Secondary | ICD-10-CM

## 2024-03-13 DIAGNOSIS — Z1211 Encounter for screening for malignant neoplasm of colon: Secondary | ICD-10-CM

## 2024-03-13 DIAGNOSIS — Z23 Encounter for immunization: Secondary | ICD-10-CM

## 2024-03-13 DIAGNOSIS — M109 Gout, unspecified: Secondary | ICD-10-CM

## 2024-03-13 LAB — POCT GLYCOSYLATED HEMOGLOBIN (HGB A1C): Hemoglobin A1C: 6.4 % — AB (ref 4.0–5.6)

## 2024-03-13 NOTE — Assessment & Plan Note (Signed)
 Well-controlled with an A1c of 6.4.  Currently on Ozempic  and Jardiance .  Off metformin .

## 2024-03-13 NOTE — Assessment & Plan Note (Signed)
-  Discussed healthy lifestyle, including increased physical activity and better food choices to promote weight loss. - He will continue to work on lifestyle changes. - Can consider increasing semaglutide  dosing to potentiate weight loss.

## 2024-03-13 NOTE — Assessment & Plan Note (Signed)
 On atorvastatin , check lipids next visit.

## 2024-03-13 NOTE — Assessment & Plan Note (Signed)
Well controlled on allopurinol

## 2024-03-13 NOTE — Assessment & Plan Note (Signed)
 On oral B12 replacement.

## 2024-03-13 NOTE — Progress Notes (Signed)
 "   New Patient Office Visit     CC/Reason for Visit: Establish care, discuss chronic conditions Previous PCP: Dr. Roanna Last Visit: Unknown  HPI: Alan Payne. is a 54 y.o. male who is coming in today for the above mentioned reasons. Past Medical History is significant for: Hypertension, hyperlipidemia, type 2 diabetes, gout, vitamin B12 deficiency and obstructive sleep apnea.  He is feeling well and has no acute concerns or complaints.  Has routine diabetic eye exams.  Had a colonoscopy in 2020 but was advised to 5-year follow-up due to history of polyps and has not yet scheduled.  Is requesting flu and pneumonia vaccines today.   Past Medical/Surgical History: Past Medical History:  Diagnosis Date   Acute renal failure (ARF) 11/30/2017   B12 deficiency    Bilateral ureteral calculi    Constipation    E-coli UTI 03/2013   Gout    Grade I diastolic dysfunction 11/27/2017   Noted on ECHO   Hematuria 03/25/2012   History of kidney stones 11/30/2017   Left renal stone   Hydronephrosis, right    Hyperlipidemia associated with type 2 diabetes mellitus (HCC)    Hypertension    LVH (left ventricular hypertrophy) 11/27/2017   Moderate, noted on ECHO   Morbid obesity (HCC)    OSA on CPAP    Sinus tachycardia    Sleep apnea    Type 2 diabetes mellitus (HCC)    Wears glasses     Past Surgical History:  Procedure Laterality Date   CIRCUMCISION  1993   CYSTOSCOPY W/ URETERAL STENT PLACEMENT Left 11/25/2017   Procedure: CYSTOSCOPY WITH RETROGRADE PYELOGRAM/URETERAL STENT PLACEMENT;  Surgeon: Sherrilee Belvie CROME, MD;  Location: [facility];  Service: Urology;  Laterality: Left;   CYSTOSCOPY WITH RETROGRADE PYELOGRAM, URETEROSCOPY AND STENT PLACEMENT Left 12/11/2017   Procedure: CYSTOSCOPY WITH RETROGRADE PYELOGRAM, URETEROSCOPY AND STENT PLACEMENT;  Surgeon: Sherrilee Belvie CROME, MD;  Location: [facility];  Service: Urology;  Laterality: Left;  1 HR   EXTRACORPOREAL  SHOCK WAVE LITHOTRIPSY Left 07-21-2014   HOLMIUM LASER APPLICATION Left 12/11/2017   Procedure: HOLMIUM LASER APPLICATION;  Surgeon: Sherrilee Belvie CROME, MD;  Location: [facility];  Service: Urology;  Laterality: Left;   VASECTOMY      Social History:  reports that he has never smoked. He has never used smokeless tobacco. He reports current alcohol use of about 1.0 standard drink of alcohol per week. He reports that he does not use drugs.  Allergies: Allergies[1]  Family History:  Family History  Problem Relation Age of Onset   Kidney cancer Father    Hypertension Father    Cancer Father        kidney   Diabetes Mother    Prostate cancer Neg Hx    Colon cancer Neg Hx    Rectal cancer Neg Hx    Stomach cancer Neg Hx    Esophageal cancer Neg Hx     Current Medications[2]  Review of Systems:  Negative except as indicated in HPI.   Physical Exam: Vitals:   03/13/24 1010 03/13/24 1045  BP: 130/80 128/78  Pulse: 73   Temp: 98.6 F (37 C)   TempSrc: Oral   SpO2: 98%   Weight: 275 lb 4.8 oz (124.9 kg)   Height: 6' (1.829 m)    Body mass index is 37.34 kg/m.  Physical Exam Vitals reviewed.  Constitutional:      Appearance: Normal appearance.  HENT:  Head: Normocephalic and atraumatic.  Eyes:     Conjunctiva/sclera: Conjunctivae normal.  Cardiovascular:     Rate and Rhythm: Normal rate and regular rhythm.  Pulmonary:     Effort: Pulmonary effort is normal.     Breath sounds: Normal breath sounds.  Skin:    General: Skin is warm and dry.  Neurological:     General: No focal deficit present.     Mental Status: He is alert and oriented to person, place, and time.  Psychiatric:        Mood and Affect: Mood normal.        Behavior: Behavior normal.        Thought Content: Thought content normal.        Judgment: Judgment normal.       Impression and Plan:  Type 2 diabetes mellitus without complication, without long-term current use of  insulin  (HCC) Assessment & Plan: Well-controlled with an A1c of 6.4.  Currently on Ozempic  and Jardiance .  Off metformin .  Orders: -     POCT glycosylated hemoglobin (Hb A1C) -     Microalbumin / creatinine urine ratio; Future  Hyperlipidemia associated with type 2 diabetes mellitus (HCC) Assessment & Plan: On atorvastatin , check lipids next visit.   B12 deficiency Assessment & Plan: On oral B12 replacement.   Hypertension, unspecified type Assessment & Plan: Blood pressure is well-controlled on current.   Gout, unspecified cause, unspecified chronicity, unspecified site Assessment & Plan: Well-controlled on allopurinol.   Morbid obesity (HCC) Assessment & Plan: -Discussed healthy lifestyle, including increased physical activity and better food choices to promote weight loss. - He will continue to work on lifestyle changes. - Can consider increasing semaglutide  dosing to potentiate weight loss.   Immunization due  -Flu and PCV 20 in office today.  Time spent: 46 minutes reviewing chart, interviewing and examining patient and formulating plan of care.    [name], MD [facility]    [1]  Allergies Allergen Reactions   Nsaids     Renal problems   Sitagliptin Phos-Metformin  Hcl Itching    Janumet  [2]  Current Outpatient Medications:    allopurinol (ZYLOPRIM) 300 MG tablet, Take 300 mg by mouth daily., Disp: , Rfl:    atorvastatin  (LIPITOR) 40 MG tablet, TAKE 1 TABLET BY MOUTH EVERY DAY, Disp: 90 tablet, Rfl: 2   Cyanocobalamin  (B-12) 5000 MCG CAPS, Take by mouth., Disp: , Rfl:    Ginkgo Biloba 40 MG TABS, Take 1 tablet by mouth every evening. , Disp: , Rfl:    JARDIANCE  25 MG TABS tablet, Take 25 mg by mouth every morning., Disp: , Rfl:    losartan  (COZAAR ) 25 MG tablet, TAKE 1 TABLET BY MOUTH EVERY DAY, Disp: 90 tablet, Rfl: 1   metFORMIN  (GLUCOPHAGE ) 1000 MG tablet, TAKE 1 TABLET (1,000 MG TOTAL) BY MOUTH 2 (TWO) TIMES  DAILY WITH A MEAL., Disp: 180 tablet, Rfl: 3   Multiple Vitamin (MULTIVITAMIN) tablet, Take 1 tablet by mouth daily., Disp: , Rfl:    ondansetron  (ZOFRAN -ODT) 8 MG disintegrating tablet, Take 1 tablet (8 mg total) by mouth every 8 (eight) hours as needed for nausea or vomiting., Disp: 20 tablet, Rfl: 0   Semaglutide , 1 MG/DOSE, 4 MG/3ML SOPN, Inject 1 mg as directed once a week., Disp: 3 mL, Rfl: 3   tamsulosin  (FLOMAX ) 0.4 MG CAPS capsule, TAKE 1 CAPSULE BY MOUTH EVERY DAY, Disp: 90 capsule, Rfl: 1   traMADol  (ULTRAM ) 50 MG tablet, Take  1-2 tablets every 8 hours as needed for moderate pain, Disp: 30 tablet, Rfl: 0   insulin  lispro (HUMALOG ) 100 UNIT/ML injection, Inject 0.05-0.1 mLs (5-10 Units total) into the skin daily. Use as needed for blood sugar >300, Disp: 10 mL, Rfl: 11   metoprolol  tartrate (LOPRESSOR ) 100 MG tablet, Take 1 tablet (100 mg total) by mouth once for 1 dose. Please take one time dose 100mg  metoprolol  tartrate 2 hr prior to cardiac CT for HR control IF HR >55bpm., Disp: 1 tablet, Rfl: 0  "

## 2024-03-13 NOTE — Addendum Note (Signed)
 Addended by: KATHRYNE MILLMAN B on: 03/13/2024 11:54 AM   Modules accepted: Orders

## 2024-03-13 NOTE — Assessment & Plan Note (Signed)
 Blood pressure is well-controlled on current.

## 2024-06-24 ENCOUNTER — Encounter: Admitting: Internal Medicine
